# Patient Record
Sex: Male | Born: 1935 | Race: White | Hispanic: No | Marital: Married | Smoking: Never smoker
Health system: Southern US, Community
[De-identification: ages and names within clinical notes are randomized; demographics above are authoritative.]

## PROBLEM LIST (undated history)

## (undated) DIAGNOSIS — R143 Flatulence: Secondary | ICD-10-CM

## (undated) DIAGNOSIS — K5730 Diverticulosis of large intestine without perforation or abscess without bleeding: Secondary | ICD-10-CM

## (undated) DIAGNOSIS — M545 Low back pain: Secondary | ICD-10-CM

## (undated) DIAGNOSIS — J019 Acute sinusitis, unspecified: Secondary | ICD-10-CM

## (undated) DIAGNOSIS — R142 Eructation: Secondary | ICD-10-CM

## (undated) DIAGNOSIS — K8020 Calculus of gallbladder without cholecystitis without obstruction: Secondary | ICD-10-CM

## (undated) DIAGNOSIS — R197 Diarrhea, unspecified: Secondary | ICD-10-CM

## (undated) DIAGNOSIS — R7309 Other abnormal glucose: Secondary | ICD-10-CM

## (undated) DIAGNOSIS — K209 Esophagitis, unspecified: Secondary | ICD-10-CM

## (undated) DIAGNOSIS — K449 Diaphragmatic hernia without obstruction or gangrene: Secondary | ICD-10-CM

## (undated) DIAGNOSIS — I10 Essential (primary) hypertension: Secondary | ICD-10-CM

## (undated) DIAGNOSIS — E785 Hyperlipidemia, unspecified: Secondary | ICD-10-CM

## (undated) DIAGNOSIS — M79609 Pain in unspecified limb: Secondary | ICD-10-CM

## (undated) DIAGNOSIS — K861 Other chronic pancreatitis: Secondary | ICD-10-CM

## (undated) DIAGNOSIS — N400 Enlarged prostate without lower urinary tract symptoms: Secondary | ICD-10-CM

## (undated) DIAGNOSIS — R141 Gas pain: Secondary | ICD-10-CM

## (undated) DIAGNOSIS — K2270 Barrett's esophagus without dysplasia: Secondary | ICD-10-CM

## (undated) DIAGNOSIS — R945 Abnormal results of liver function studies: Secondary | ICD-10-CM

## (undated) DIAGNOSIS — M109 Gout, unspecified: Secondary | ICD-10-CM

## (undated) DIAGNOSIS — K219 Gastro-esophageal reflux disease without esophagitis: Secondary | ICD-10-CM

## (undated) HISTORY — DX: Gas pain: R14.1

## (undated) HISTORY — DX: Eructation: R14.2

## (undated) HISTORY — DX: Essential (primary) hypertension: I10

## (undated) HISTORY — DX: Pain in unspecified limb: M79.609

## (undated) HISTORY — DX: Abnormal results of liver function studies: R94.5

## (undated) HISTORY — DX: Barrett's esophagus without dysplasia: K22.70

## (undated) HISTORY — DX: Hyperlipidemia, unspecified: E78.5

## (undated) HISTORY — DX: Flatulence: R14.3

## (undated) HISTORY — DX: Diverticulosis of large intestine without perforation or abscess without bleeding: K57.30

## (undated) HISTORY — DX: Calculus of gallbladder without cholecystitis without obstruction: K80.20

## (undated) HISTORY — DX: Acute sinusitis, unspecified: J01.90

## (undated) HISTORY — DX: Esophagitis, unspecified: K20.9

## (undated) HISTORY — DX: Other abnormal glucose: R73.09

## (undated) HISTORY — DX: Other chronic pancreatitis: K86.1

## (undated) HISTORY — DX: Diarrhea, unspecified: R19.7

## (undated) HISTORY — DX: Gout, unspecified: M10.9

## (undated) HISTORY — DX: Gastro-esophageal reflux disease without esophagitis: K21.9

## (undated) HISTORY — DX: Diaphragmatic hernia without obstruction or gangrene: K44.9

## (undated) HISTORY — DX: Low back pain: M54.5

## (undated) HISTORY — DX: Benign prostatic hyperplasia without lower urinary tract symptoms: N40.0

---
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
## 1981-06-25 HISTORY — PX: CHOLECYSTECTOMY: SHX55

## 1997-12-01 ENCOUNTER — Ambulatory Visit (HOSPITAL_COMMUNITY): Admission: RE | Admit: 1997-12-01 | Discharge: 1997-12-01 | Payer: Self-pay | Admitting: Internal Medicine

## 1998-01-27 ENCOUNTER — Other Ambulatory Visit: Admission: RE | Admit: 1998-01-27 | Discharge: 1998-01-27 | Payer: Self-pay | Admitting: Gastroenterology

## 1998-06-25 HISTORY — PX: UMBILICAL HERNIA REPAIR: SHX196

## 1998-12-05 ENCOUNTER — Ambulatory Visit (HOSPITAL_COMMUNITY): Admission: RE | Admit: 1998-12-05 | Discharge: 1998-12-05 | Payer: Self-pay | Admitting: Internal Medicine

## 1998-12-05 ENCOUNTER — Encounter: Payer: Self-pay | Admitting: Internal Medicine

## 1998-12-06 ENCOUNTER — Encounter: Admission: RE | Admit: 1998-12-06 | Discharge: 1998-12-06 | Payer: Self-pay | Admitting: Internal Medicine

## 1998-12-15 ENCOUNTER — Encounter: Admission: RE | Admit: 1998-12-15 | Discharge: 1998-12-15 | Payer: Self-pay | Admitting: Internal Medicine

## 1999-06-13 ENCOUNTER — Other Ambulatory Visit: Admission: RE | Admit: 1999-06-13 | Discharge: 1999-06-13 | Payer: Self-pay | Admitting: Gastroenterology

## 1999-06-13 ENCOUNTER — Encounter (INDEPENDENT_AMBULATORY_CARE_PROVIDER_SITE_OTHER): Payer: Self-pay

## 1999-10-31 ENCOUNTER — Ambulatory Visit (HOSPITAL_COMMUNITY): Admission: RE | Admit: 1999-10-31 | Discharge: 1999-10-31 | Payer: Self-pay

## 2001-03-20 ENCOUNTER — Encounter (INDEPENDENT_AMBULATORY_CARE_PROVIDER_SITE_OTHER): Payer: Self-pay | Admitting: Specialist

## 2001-03-20 ENCOUNTER — Other Ambulatory Visit: Admission: RE | Admit: 2001-03-20 | Discharge: 2001-03-20 | Payer: Self-pay | Admitting: Gastroenterology

## 2001-05-16 ENCOUNTER — Encounter: Admission: RE | Admit: 2001-05-16 | Discharge: 2001-05-16 | Payer: Self-pay | Admitting: Cardiothoracic Surgery

## 2001-05-16 ENCOUNTER — Encounter: Payer: Self-pay | Admitting: Cardiothoracic Surgery

## 2001-06-16 ENCOUNTER — Ambulatory Visit (HOSPITAL_COMMUNITY): Admission: RE | Admit: 2001-06-16 | Discharge: 2001-06-16 | Payer: Self-pay | Admitting: Gastroenterology

## 2001-06-16 ENCOUNTER — Encounter: Payer: Self-pay | Admitting: Gastroenterology

## 2001-12-30 ENCOUNTER — Ambulatory Visit (HOSPITAL_COMMUNITY): Admission: RE | Admit: 2001-12-30 | Discharge: 2001-12-30 | Payer: Self-pay | Admitting: Internal Medicine

## 2001-12-30 ENCOUNTER — Encounter: Payer: Self-pay | Admitting: Internal Medicine

## 2002-07-03 ENCOUNTER — Encounter: Payer: Self-pay | Admitting: Cardiothoracic Surgery

## 2002-07-03 ENCOUNTER — Encounter: Payer: Self-pay | Admitting: Internal Medicine

## 2002-07-03 ENCOUNTER — Encounter: Admission: RE | Admit: 2002-07-03 | Discharge: 2002-07-03 | Payer: Self-pay | Admitting: Cardiothoracic Surgery

## 2002-07-03 ENCOUNTER — Encounter: Admission: RE | Admit: 2002-07-03 | Discharge: 2002-07-03 | Payer: Self-pay | Admitting: Internal Medicine

## 2002-12-24 ENCOUNTER — Encounter: Payer: Self-pay | Admitting: Cardiology

## 2003-07-16 ENCOUNTER — Encounter: Admission: RE | Admit: 2003-07-16 | Discharge: 2003-07-16 | Payer: Self-pay | Admitting: Cardiothoracic Surgery

## 2005-01-09 ENCOUNTER — Ambulatory Visit: Payer: Self-pay | Admitting: Internal Medicine

## 2005-01-15 ENCOUNTER — Ambulatory Visit: Payer: Self-pay | Admitting: Internal Medicine

## 2005-04-05 ENCOUNTER — Ambulatory Visit: Payer: Self-pay | Admitting: Gastroenterology

## 2005-04-24 ENCOUNTER — Encounter (INDEPENDENT_AMBULATORY_CARE_PROVIDER_SITE_OTHER): Payer: Self-pay | Admitting: Specialist

## 2005-04-24 ENCOUNTER — Ambulatory Visit: Payer: Self-pay | Admitting: Gastroenterology

## 2005-05-08 ENCOUNTER — Ambulatory Visit: Payer: Self-pay | Admitting: Internal Medicine

## 2005-07-24 ENCOUNTER — Ambulatory Visit: Payer: Self-pay | Admitting: Internal Medicine

## 2005-08-03 ENCOUNTER — Encounter: Admission: RE | Admit: 2005-08-03 | Discharge: 2005-08-03 | Payer: Self-pay | Admitting: Cardiothoracic Surgery

## 2005-10-15 ENCOUNTER — Ambulatory Visit: Payer: Self-pay | Admitting: Internal Medicine

## 2005-11-29 ENCOUNTER — Ambulatory Visit: Payer: Self-pay | Admitting: Internal Medicine

## 2005-12-12 ENCOUNTER — Ambulatory Visit: Payer: Self-pay | Admitting: Gastroenterology

## 2006-01-08 ENCOUNTER — Ambulatory Visit: Payer: Self-pay | Admitting: Internal Medicine

## 2006-01-16 ENCOUNTER — Ambulatory Visit: Payer: Self-pay | Admitting: Internal Medicine

## 2006-01-17 ENCOUNTER — Encounter (INDEPENDENT_AMBULATORY_CARE_PROVIDER_SITE_OTHER): Payer: Self-pay | Admitting: Specialist

## 2006-01-17 ENCOUNTER — Ambulatory Visit: Payer: Self-pay | Admitting: Gastroenterology

## 2006-03-28 ENCOUNTER — Ambulatory Visit: Payer: Self-pay

## 2007-01-15 ENCOUNTER — Ambulatory Visit: Payer: Self-pay | Admitting: Internal Medicine

## 2007-01-15 LAB — CONVERTED CEMR LAB
ALT: 48 units/L (ref 0–53)
AST: 31 units/L (ref 0–37)
Albumin: 4.3 g/dL (ref 3.5–5.2)
Alkaline Phosphatase: 127 units/L — ABNORMAL HIGH (ref 39–117)
BUN: 18 mg/dL (ref 6–23)
Basophils Absolute: 0 10*3/uL (ref 0.0–0.1)
Basophils Relative: 0.2 % (ref 0.0–1.0)
Bilirubin Urine: NEGATIVE
Bilirubin, Direct: 0.1 mg/dL (ref 0.0–0.3)
CO2: 31 meq/L (ref 19–32)
Calcium: 9.4 mg/dL (ref 8.4–10.5)
Chloride: 105 meq/L (ref 96–112)
Cholesterol: 209 mg/dL (ref 0–200)
Creatinine, Ser: 1.1 mg/dL (ref 0.4–1.5)
Direct LDL: 109.7 mg/dL
Eosinophils Absolute: 0.3 10*3/uL (ref 0.0–0.6)
Eosinophils Relative: 3.7 % (ref 0.0–5.0)
GFR calc Af Amer: 85 mL/min
GFR calc non Af Amer: 70 mL/min
Glucose, Bld: 102 mg/dL — ABNORMAL HIGH (ref 70–99)
HCT: 45.3 % (ref 39.0–52.0)
HDL: 48.9 mg/dL (ref >39.0)
Hemoglobin, Urine: NEGATIVE
Hemoglobin: 15.5 g/dL (ref 13.0–17.0)
Ketones, ur: NEGATIVE mg/dL
Leukocytes, UA: NEGATIVE
Lymphocytes Relative: 18.9 % (ref 12.0–46.0)
MCHC: 34.1 g/dL (ref 30.0–36.0)
MCV: 91 fL (ref 78.0–100.0)
Monocytes Absolute: 0.7 10*3/uL (ref 0.2–0.7)
Monocytes Relative: 8.6 % (ref 3.0–11.0)
Neutro Abs: 5.4 10*3/uL (ref 1.4–7.7)
Neutrophils Relative %: 68.6 % (ref 43.0–77.0)
Nitrite: NEGATIVE
PSA: 1 ng/mL (ref 0.10–4.00)
Platelets: 216 10*3/uL (ref 150–400)
Potassium: 4.6 meq/L (ref 3.5–5.1)
RBC: 4.98 M/uL (ref 4.22–5.81)
RDW: 12.5 % (ref 11.5–14.6)
Sodium: 143 meq/L (ref 135–145)
Specific Gravity, Urine: 1.025 (ref 1.000–1.03)
TSH: 0.67 microintl units/mL (ref 0.35–5.50)
Total Bilirubin: 1.3 mg/dL — ABNORMAL HIGH (ref 0.3–1.2)
Total CHOL/HDL Ratio: 4.3
Total Protein, Urine: NEGATIVE mg/dL
Total Protein: 6.8 g/dL (ref 6.0–8.3)
Triglycerides: 184 mg/dL — ABNORMAL HIGH (ref 0–149)
Urine Glucose: NEGATIVE mg/dL
Urobilinogen, UA: 0.2 (ref 0.0–1.0)
VLDL: 37 mg/dL (ref 0–40)
WBC: 7.9 10*3/uL (ref 4.5–10.5)
pH: 6 (ref 5.0–8.0)

## 2007-01-23 ENCOUNTER — Ambulatory Visit: Payer: Self-pay | Admitting: Internal Medicine

## 2007-01-28 DIAGNOSIS — K209 Esophagitis, unspecified without bleeding: Secondary | ICD-10-CM | POA: Insufficient documentation

## 2007-01-28 DIAGNOSIS — E785 Hyperlipidemia, unspecified: Secondary | ICD-10-CM

## 2007-01-28 DIAGNOSIS — I10 Essential (primary) hypertension: Secondary | ICD-10-CM

## 2007-01-28 HISTORY — DX: Hyperlipidemia, unspecified: E78.5

## 2007-01-28 HISTORY — DX: Esophagitis, unspecified without bleeding: K20.90

## 2007-01-28 HISTORY — DX: Essential (primary) hypertension: I10

## 2007-04-23 DIAGNOSIS — M109 Gout, unspecified: Secondary | ICD-10-CM

## 2007-04-23 DIAGNOSIS — N400 Enlarged prostate without lower urinary tract symptoms: Secondary | ICD-10-CM

## 2007-04-23 DIAGNOSIS — K219 Gastro-esophageal reflux disease without esophagitis: Secondary | ICD-10-CM

## 2007-04-23 HISTORY — DX: Gastro-esophageal reflux disease without esophagitis: K21.9

## 2007-04-23 HISTORY — DX: Gout, unspecified: M10.9

## 2007-04-23 HISTORY — DX: Benign prostatic hyperplasia without lower urinary tract symptoms: N40.0

## 2007-05-14 ENCOUNTER — Ambulatory Visit: Payer: Self-pay | Admitting: Internal Medicine

## 2007-05-14 DIAGNOSIS — R945 Abnormal results of liver function studies: Secondary | ICD-10-CM

## 2007-05-14 DIAGNOSIS — J019 Acute sinusitis, unspecified: Secondary | ICD-10-CM

## 2007-05-14 HISTORY — DX: Acute sinusitis, unspecified: J01.90

## 2007-05-14 HISTORY — DX: Abnormal results of liver function studies: R94.5

## 2007-05-20 ENCOUNTER — Encounter: Payer: Self-pay | Admitting: Internal Medicine

## 2007-09-10 ENCOUNTER — Telehealth: Payer: Self-pay | Admitting: Internal Medicine

## 2007-12-23 ENCOUNTER — Ambulatory Visit: Payer: Self-pay | Admitting: Gastroenterology

## 2008-01-07 ENCOUNTER — Encounter: Payer: Self-pay | Admitting: Gastroenterology

## 2008-01-07 ENCOUNTER — Ambulatory Visit: Payer: Self-pay | Admitting: Gastroenterology

## 2008-01-12 ENCOUNTER — Encounter: Payer: Self-pay | Admitting: Gastroenterology

## 2008-01-20 ENCOUNTER — Ambulatory Visit: Payer: Self-pay | Admitting: Internal Medicine

## 2008-01-21 LAB — CONVERTED CEMR LAB
ALT: 90 units/L — ABNORMAL HIGH (ref 0–53)
AST: 53 units/L — ABNORMAL HIGH (ref 0–37)
Albumin: 4.2 g/dL (ref 3.5–5.2)
Alkaline Phosphatase: 168 units/L — ABNORMAL HIGH (ref 39–117)
BUN: 20 mg/dL (ref 6–23)
Basophils Absolute: 0 10*3/uL (ref 0.0–0.1)
Basophils Relative: 0.3 % (ref 0.0–3.0)
Bilirubin Urine: NEGATIVE
Bilirubin, Direct: 0.2 mg/dL (ref 0.0–0.3)
CO2: 29 meq/L (ref 19–32)
Calcium: 9.3 mg/dL (ref 8.4–10.5)
Chloride: 104 meq/L (ref 96–112)
Cholesterol: 227 mg/dL (ref 0–200)
Creatinine, Ser: 1.2 mg/dL (ref 0.4–1.5)
Direct LDL: 124.9 mg/dL
Eosinophils Absolute: 0.3 10*3/uL (ref 0.0–0.7)
Eosinophils Relative: 4.7 % (ref 0.0–5.0)
GFR calc Af Amer: 77 mL/min
GFR calc non Af Amer: 63 mL/min
Glucose, Bld: 114 mg/dL — ABNORMAL HIGH (ref 70–99)
HCT: 44.6 % (ref 39.0–52.0)
HDL: 51.1 mg/dL (ref >39.0)
Hemoglobin, Urine: NEGATIVE
Hemoglobin: 15.6 g/dL (ref 13.0–17.0)
Ketones, ur: NEGATIVE mg/dL
Leukocytes, UA: NEGATIVE
Lymphocytes Relative: 20.2 % (ref 12.0–46.0)
MCHC: 35 g/dL (ref 30.0–36.0)
MCV: 91.9 fL (ref 78.0–100.0)
Monocytes Absolute: 0.7 10*3/uL (ref 0.1–1.0)
Monocytes Relative: 9.4 % (ref 3.0–12.0)
Neutro Abs: 4.9 10*3/uL (ref 1.4–7.7)
Neutrophils Relative %: 65.4 % (ref 43.0–77.0)
Nitrite: NEGATIVE
PSA: 1.17 ng/mL (ref 0.10–4.00)
Platelets: 201 10*3/uL (ref 150–400)
Potassium: 4.4 meq/L (ref 3.5–5.1)
RBC: 4.85 M/uL (ref 4.22–5.81)
RDW: 13.1 % (ref 11.5–14.6)
Sodium: 140 meq/L (ref 135–145)
Specific Gravity, Urine: 1.02 (ref 1.000–1.03)
TSH: 0.5 microintl units/mL (ref 0.35–5.50)
Total Bilirubin: 1.4 mg/dL — ABNORMAL HIGH (ref 0.3–1.2)
Total CHOL/HDL Ratio: 4.4
Total Protein, Urine: NEGATIVE mg/dL
Total Protein: 6.8 g/dL (ref 6.0–8.3)
Triglycerides: 244 mg/dL (ref 0–149)
Urine Glucose: NEGATIVE mg/dL
Urobilinogen, UA: 0.2 (ref 0.0–1.0)
VLDL: 49 mg/dL — ABNORMAL HIGH (ref 0–40)
WBC: 7.4 10*3/uL (ref 4.5–10.5)
pH: 6 (ref 5.0–8.0)

## 2008-01-26 ENCOUNTER — Ambulatory Visit: Payer: Self-pay | Admitting: Internal Medicine

## 2008-01-26 DIAGNOSIS — R197 Diarrhea, unspecified: Secondary | ICD-10-CM

## 2008-01-26 DIAGNOSIS — R7309 Other abnormal glucose: Secondary | ICD-10-CM

## 2008-01-26 HISTORY — DX: Other abnormal glucose: R73.09

## 2008-01-26 HISTORY — DX: Diarrhea, unspecified: R19.7

## 2008-02-09 DIAGNOSIS — K2270 Barrett's esophagus without dysplasia: Secondary | ICD-10-CM

## 2008-02-09 DIAGNOSIS — K449 Diaphragmatic hernia without obstruction or gangrene: Secondary | ICD-10-CM | POA: Insufficient documentation

## 2008-02-09 HISTORY — DX: Diaphragmatic hernia without obstruction or gangrene: K44.9

## 2008-02-09 HISTORY — DX: Barrett's esophagus without dysplasia: K22.70

## 2008-02-10 ENCOUNTER — Ambulatory Visit: Payer: Self-pay | Admitting: Gastroenterology

## 2008-02-10 DIAGNOSIS — K5730 Diverticulosis of large intestine without perforation or abscess without bleeding: Secondary | ICD-10-CM | POA: Insufficient documentation

## 2008-02-10 DIAGNOSIS — K861 Other chronic pancreatitis: Secondary | ICD-10-CM

## 2008-02-10 HISTORY — DX: Other chronic pancreatitis: K86.1

## 2008-02-10 HISTORY — DX: Diverticulosis of large intestine without perforation or abscess without bleeding: K57.30

## 2008-05-25 ENCOUNTER — Telehealth: Payer: Self-pay | Admitting: Internal Medicine

## 2008-08-18 ENCOUNTER — Telehealth: Payer: Self-pay | Admitting: Internal Medicine

## 2008-09-13 ENCOUNTER — Telehealth: Payer: Self-pay | Admitting: Internal Medicine

## 2008-11-08 ENCOUNTER — Telehealth: Payer: Self-pay | Admitting: Internal Medicine

## 2008-11-19 ENCOUNTER — Encounter (INDEPENDENT_AMBULATORY_CARE_PROVIDER_SITE_OTHER): Payer: Self-pay | Admitting: *Deleted

## 2009-02-14 ENCOUNTER — Ambulatory Visit: Payer: Self-pay | Admitting: Internal Medicine

## 2009-02-14 LAB — CONVERTED CEMR LAB
ALT: 55 units/L — ABNORMAL HIGH (ref 0–53)
AST: 27 units/L (ref 0–37)
Albumin: 4.3 g/dL (ref 3.5–5.2)
Alkaline Phosphatase: 164 units/L — ABNORMAL HIGH (ref 39–117)
BUN: 19 mg/dL (ref 6–23)
Basophils Absolute: 0 10*3/uL (ref 0.0–0.1)
Basophils Relative: 0.3 % (ref 0.0–3.0)
Bilirubin Urine: NEGATIVE
Bilirubin, Direct: 0.1 mg/dL (ref 0.0–0.3)
CO2: 30 meq/L (ref 19–32)
Calcium: 9.4 mg/dL (ref 8.4–10.5)
Chloride: 107 meq/L (ref 96–112)
Cholesterol: 200 mg/dL (ref 0–200)
Creatinine, Ser: 1.1 mg/dL (ref 0.4–1.5)
Direct LDL: 94.5 mg/dL
Eosinophils Absolute: 0.3 10*3/uL (ref 0.0–0.7)
Eosinophils Relative: 4.2 % (ref 0.0–5.0)
GFR calc non Af Amer: 69.88 mL/min (ref >60)
Glucose, Bld: 105 mg/dL — ABNORMAL HIGH (ref 70–99)
HCT: 45.2 % (ref 39.0–52.0)
HDL: 47.4 mg/dL (ref >39.00)
Hemoglobin: 15.4 g/dL (ref 13.0–17.0)
Ketones, ur: NEGATIVE mg/dL
Leukocytes, UA: NEGATIVE
Lymphocytes Relative: 23.9 % (ref 12.0–46.0)
Lymphs Abs: 1.7 10*3/uL (ref 0.7–4.0)
MCHC: 34 g/dL (ref 30.0–36.0)
MCV: 93.8 fL (ref 78.0–100.0)
Monocytes Absolute: 0.7 10*3/uL (ref 0.1–1.0)
Monocytes Relative: 9.8 % (ref 3.0–12.0)
Neutro Abs: 4.3 10*3/uL (ref 1.4–7.7)
Neutrophils Relative %: 61.8 % (ref 43.0–77.0)
Nitrite: NEGATIVE
PSA: 0.8 ng/mL (ref 0.10–4.00)
Platelets: 208 10*3/uL (ref 150.0–400.0)
Potassium: 4.6 meq/L (ref 3.5–5.1)
RBC: 4.82 M/uL (ref 4.22–5.81)
RDW: 13.4 % (ref 11.5–14.6)
Sodium: 142 meq/L (ref 135–145)
Specific Gravity, Urine: 1.02 (ref 1.000–1.030)
TSH: 0.59 microintl units/mL (ref 0.35–5.50)
Total Bilirubin: 1.1 mg/dL (ref 0.3–1.2)
Total CHOL/HDL Ratio: 4
Total Protein, Urine: NEGATIVE mg/dL
Total Protein: 7.7 g/dL (ref 6.0–8.3)
Triglycerides: 344 mg/dL — ABNORMAL HIGH (ref 0.0–149.0)
Urine Glucose: NEGATIVE mg/dL
Urobilinogen, UA: 0.2 (ref 0.0–1.0)
VLDL: 68.8 mg/dL — ABNORMAL HIGH (ref 0.0–40.0)
WBC: 7 10*3/uL (ref 4.5–10.5)
pH: 6 (ref 5.0–8.0)

## 2009-02-21 ENCOUNTER — Ambulatory Visit: Payer: Self-pay | Admitting: Internal Medicine

## 2009-03-29 ENCOUNTER — Encounter (INDEPENDENT_AMBULATORY_CARE_PROVIDER_SITE_OTHER): Payer: Self-pay | Admitting: *Deleted

## 2009-04-25 ENCOUNTER — Ambulatory Visit: Payer: Self-pay | Admitting: Gastroenterology

## 2009-06-06 ENCOUNTER — Telehealth: Payer: Self-pay | Admitting: Gastroenterology

## 2009-07-08 ENCOUNTER — Encounter (INDEPENDENT_AMBULATORY_CARE_PROVIDER_SITE_OTHER): Payer: Self-pay | Admitting: *Deleted

## 2009-07-11 ENCOUNTER — Ambulatory Visit: Payer: Self-pay | Admitting: Gastroenterology

## 2009-07-18 ENCOUNTER — Ambulatory Visit: Payer: Self-pay | Admitting: Gastroenterology

## 2009-07-20 ENCOUNTER — Encounter: Payer: Self-pay | Admitting: Gastroenterology

## 2009-09-29 ENCOUNTER — Telehealth: Payer: Self-pay | Admitting: Internal Medicine

## 2009-10-10 ENCOUNTER — Ambulatory Visit: Payer: Self-pay | Admitting: Internal Medicine

## 2009-10-10 DIAGNOSIS — M79609 Pain in unspecified limb: Secondary | ICD-10-CM

## 2009-10-10 HISTORY — DX: Pain in unspecified limb: M79.609

## 2010-03-07 ENCOUNTER — Ambulatory Visit: Payer: Self-pay | Admitting: Internal Medicine

## 2010-03-07 LAB — CONVERTED CEMR LAB
ALT: 119 units/L — ABNORMAL HIGH (ref 0–53)
AST: 49 units/L — ABNORMAL HIGH (ref 0–37)
Albumin: 4.2 g/dL (ref 3.5–5.2)
Alkaline Phosphatase: 310 units/L — ABNORMAL HIGH (ref 39–117)
BUN: 20 mg/dL (ref 6–23)
Basophils Absolute: 0 10*3/uL (ref 0.0–0.1)
Basophils Relative: 0.4 % (ref 0.0–3.0)
Bilirubin Urine: NEGATIVE
Bilirubin, Direct: 0.2 mg/dL (ref 0.0–0.3)
CO2: 31 meq/L (ref 19–32)
Calcium: 9.5 mg/dL (ref 8.4–10.5)
Chloride: 105 meq/L (ref 96–112)
Cholesterol: 200 mg/dL (ref 0–200)
Creatinine, Ser: 1.1 mg/dL (ref 0.4–1.5)
Direct LDL: 104.8 mg/dL
Eosinophils Absolute: 0.2 10*3/uL (ref 0.0–0.7)
Eosinophils Relative: 3.6 % (ref 0.0–5.0)
GFR calc non Af Amer: 72.72 mL/min (ref >60)
Glucose, Bld: 102 mg/dL — ABNORMAL HIGH (ref 70–99)
HCT: 43.6 % (ref 39.0–52.0)
HDL: 49.1 mg/dL (ref >39.00)
Hemoglobin, Urine: NEGATIVE
Hemoglobin: 15 g/dL (ref 13.0–17.0)
Ketones, ur: NEGATIVE mg/dL
Leukocytes, UA: NEGATIVE
Lymphocytes Relative: 25.7 % (ref 12.0–46.0)
Lymphs Abs: 1.7 10*3/uL (ref 0.7–4.0)
MCHC: 34.4 g/dL (ref 30.0–36.0)
MCV: 96.2 fL (ref 78.0–100.0)
Monocytes Absolute: 0.6 10*3/uL (ref 0.1–1.0)
Monocytes Relative: 8.3 % (ref 3.0–12.0)
Neutro Abs: 4.1 10*3/uL (ref 1.4–7.7)
Neutrophils Relative %: 62 % (ref 43.0–77.0)
Nitrite: NEGATIVE
PSA: 0.92 ng/mL (ref 0.10–4.00)
Platelets: 247 10*3/uL (ref 150.0–400.0)
Potassium: 5.4 meq/L — ABNORMAL HIGH (ref 3.5–5.1)
RBC: 4.53 M/uL (ref 4.22–5.81)
RDW: 14.4 % (ref 11.5–14.6)
Sodium: 143 meq/L (ref 135–145)
Specific Gravity, Urine: 1.02 (ref 1.000–1.030)
TSH: 0.53 microintl units/mL (ref 0.35–5.50)
Total Bilirubin: 0.8 mg/dL (ref 0.3–1.2)
Total CHOL/HDL Ratio: 4
Total Protein, Urine: NEGATIVE mg/dL
Total Protein: 7 g/dL (ref 6.0–8.3)
Triglycerides: 258 mg/dL — ABNORMAL HIGH (ref 0.0–149.0)
Urine Glucose: NEGATIVE mg/dL
Urobilinogen, UA: 0.2 (ref 0.0–1.0)
VLDL: 51.6 mg/dL — ABNORMAL HIGH (ref 0.0–40.0)
WBC: 6.6 10*3/uL (ref 4.5–10.5)
pH: 6 (ref 5.0–8.0)

## 2010-03-13 ENCOUNTER — Ambulatory Visit: Payer: Self-pay | Admitting: Internal Medicine

## 2010-03-20 ENCOUNTER — Encounter: Admission: RE | Admit: 2010-03-20 | Discharge: 2010-03-20 | Payer: Self-pay | Admitting: Internal Medicine

## 2010-03-23 ENCOUNTER — Telehealth: Payer: Self-pay | Admitting: Internal Medicine

## 2010-04-12 ENCOUNTER — Telehealth: Payer: Self-pay | Admitting: Internal Medicine

## 2010-04-17 ENCOUNTER — Ambulatory Visit: Payer: Self-pay | Admitting: Internal Medicine

## 2010-04-18 LAB — CONVERTED CEMR LAB
ALT: 90 units/L — ABNORMAL HIGH (ref 0–53)
AST: 43 units/L — ABNORMAL HIGH (ref 0–37)
Albumin: 4 g/dL (ref 3.5–5.2)
Alkaline Phosphatase: 241 units/L — ABNORMAL HIGH (ref 39–117)
BUN: 19 mg/dL (ref 6–23)
Bilirubin, Direct: 0.3 mg/dL (ref 0.0–0.3)
CO2: 26 meq/L (ref 19–32)
Calcium: 9.1 mg/dL (ref 8.4–10.5)
Chloride: 104 meq/L (ref 96–112)
Creatinine, Ser: 1 mg/dL (ref 0.4–1.5)
GFR calc non Af Amer: 76 mL/min (ref >60)
Glucose, Bld: 89 mg/dL (ref 70–99)
Potassium: 4.4 meq/L (ref 3.5–5.1)
Sodium: 139 meq/L (ref 135–145)
Total Bilirubin: 1.1 mg/dL (ref 0.3–1.2)
Total Protein: 6.5 g/dL (ref 6.0–8.3)

## 2010-04-24 ENCOUNTER — Ambulatory Visit: Payer: Self-pay | Admitting: Internal Medicine

## 2010-05-04 ENCOUNTER — Telehealth: Payer: Self-pay | Admitting: Internal Medicine

## 2010-05-10 ENCOUNTER — Ambulatory Visit: Payer: Self-pay | Admitting: Internal Medicine

## 2010-06-08 ENCOUNTER — Telehealth: Payer: Self-pay | Admitting: Internal Medicine

## 2010-06-12 ENCOUNTER — Ambulatory Visit: Payer: Self-pay | Admitting: Internal Medicine

## 2010-06-12 LAB — CONVERTED CEMR LAB
ALT: 87 units/L — ABNORMAL HIGH (ref 0–53)
AST: 47 units/L — ABNORMAL HIGH (ref 0–37)
Albumin: 4.2 g/dL (ref 3.5–5.2)
Alkaline Phosphatase: 208 units/L — ABNORMAL HIGH (ref 39–117)
Bilirubin, Direct: 0.3 mg/dL (ref 0.0–0.3)
Total Bilirubin: 1.7 mg/dL — ABNORMAL HIGH (ref 0.3–1.2)
Total Protein: 6.9 g/dL (ref 6.0–8.3)
Uric Acid, Serum: 7.1 mg/dL (ref 4.0–7.8)

## 2010-07-25 NOTE — Progress Notes (Signed)
Summary: Allopurinol   Phone Note From Pharmacy   Summary of Call: Pharm has pt filling allopurinol 300mg  once daily and EMR has 100mg . Ok to continue what pharm has?  Initial call taken by: Lamar Sprinkles, CMA,  March 23, 2010 10:39 AM  Follow-up for Phone Call        Hew needs 100 mg/day pls, not 300 mg Follow-up by: Tresa Garter MD,  March 23, 2010 11:37 AM    Prescriptions: LOTREL 5-20 MG  CAPS (AMLODIPINE BESY-BENAZEPRIL HCL) once daily  #90 x 3   Entered by:   Lamar Sprinkles, CMA   Authorized by:   Tresa Garter MD   Signed by:   Lamar Sprinkles, CMA on 03/23/2010   Method used:   [id number]       [address]       [place], [place]  [address]       Ph: [phone] or [phone]       Fax: [phone]   RxID:   [id number] AVODART 0.5 MG  CAPS (DUTASTERIDE) once daily  #90 x 3   Entered by:   Lamar Sprinkles, CMA   Authorized by:   Tresa Garter MD   Signed by:   Lamar Sprinkles, CMA on 03/23/2010   Method used:   [facility] to        [id number]       [address]       [place], [place]  [address]       Ph: [phone] or [phone]       Fax: [phone]   RxID:   [id number] ALLOPURINOL 100 MG TABS (ALLOPURINOL) 1 by mouth once daily for gout prevention  #90 x 3   Entered by:   Lamar Sprinkles, CMA   Authorized by:   Tresa Garter MD   Signed by:   Lamar Sprinkles, CMA on 03/23/2010   Method used:   [facility]        [id number]       [address]       [place], [place]  [address]       Ph: [phone] or [phone]       Fax: [phone]   RxID:   [id number]

## 2010-07-25 NOTE — Assessment & Plan Note (Signed)
Summary: CPX/ [employer] /NWS  #   Vital Signs:  Patient profile:   76 year old male Height:      71 inches Weight:      212 pounds BMI:     29.67 Temp:     98.9 degrees F oral Pulse rate:   80 / minute Pulse rhythm:   regular Resp:     16 per minute BP sitting:   122 / 78  (left arm) Cuff size:   regular  Vitals Entered By: Lanier Prude, [name] Gust) (March 13, 2010 3:10 PM) CC: CPX Is Patient Diabetic? No   Primary Care Provider:  Trinna Post Ulys Favia,MD  CC:  CPX.  History of Present Illness: The patient presents for a preventive health examination   Preventive Screening-Counseling & Management  Alcohol-Tobacco     Smoking Status: never  Current Medications (verified): 1)  Lotrel 5-20 Mg  Caps (Amlodipine Besy-Benazepril Hcl) .... Once Daily 2)  Valturna 150-160 Mg Tabs (Aliskiren-Valsartan) .Marland Kitchen.. 1 By Mouth Once Daily For Blood Pressure 3)  Nexium 40 Mg  Cpdr (Esomeprazole Magnesium) .Marland Kitchen.. 1 By Mouth Bid 4)  Welchol 625 Mg  Tabs (Colesevelam Hcl) .Marland Kitchen.. 1 Two Times A Day 5)  Avodart 0.5 Mg  Caps (Dutasteride) .... Once Daily 6)  Viagra 100 Mg Tabs (Sildenafil Citrate) .Marland Kitchen.. 1 Once Daily Prn 7)  Vitamin D3 1000 Unit  Tabs (Cholecalciferol) .Marland Kitchen.. 1 Qd 8)  Allopurinol 300 Mg Tabs (Allopurinol) .... Take 1 Tab Every Day 9)  Pennsaid 1.5 % Soln (Diclofenac Sodium) .... 3-5 Gtt On Skin Three Times A Day For Pain  Allergies (verified): 1)  ! Kapidex  Past History:  Past Medical History: Last updated: 02/21/2009 Hyperlipidemia Hypertension Elevated LFTs since the 80's GERD/ Barrett's -- Dr Jarold Motto Benign prostatic hypertrophy Gout, remote Elev. glu Chronic diarrhea  Past Surgical History: Last updated: 04/23/2007 Cholecystectomy  Family History: Last updated: 02/10/2008 F laryngeal CA M 95 No FH of Colon Cancer:  Social History: Retired Married Never Smoked Alcohol use-yes socially wine 3/d Regular exercise-yes  Illicit Drug Use - no s  Review of  Systems  The patient denies anorexia, fever, weight loss, weight gain, vision loss, decreased hearing, hoarseness, chest pain, syncope, dyspnea on exertion, peripheral edema, prolonged cough, headaches, hemoptysis, abdominal pain, melena, hematochezia, severe indigestion/heartburn, hematuria, incontinence, genital sores, muscle weakness, suspicious skin lesions, transient blindness, difficulty walking, depression, unusual weight change, abnormal bleeding, enlarged lymph nodes, angioedema, and testicular masses.    Physical Exam  General:  Well developed, well nourished, no acute distress.healthy appearing.   Head:  Normocephalic and atraumatic without obvious abnormalities. No apparent alopecia or balding. Eyes:  No corneal or conjunctival inflammation noted. EOMI. Perrla.  Ears:  External ear exam shows no significant lesions or deformities.  Otoscopic examination reveals clear canals, tympanic membranes are intact bilaterally without bulging, retraction, inflammation or discharge. Hearing is grossly normal bilaterally. Nose:  External nasal examination shows no deformity or inflammation. Nasal mucosa are pink and moist without lesions or exudates. Mouth:  Oral mucosa and oropharynx without lesions or exudates.  Teeth in good repair. Neck:  No deformities, masses, or tenderness noted. Lungs:  Normal respiratory effort, chest expands symmetrically. Lungs are clear to auscultation, no crackles or wheezes. Heart:  Normal rate and regular rhythm. S1 and S2 normal without gallop, murmur, click, rub or other extra sounds. Abdomen:  Bowel sounds positive,abdomen soft and non-tender without masses, organomegaly or hernias noted. Rectal:  No external abnormalities noted. Normal sphincter tone. No rectal  masses or tenderness. Genitalia:  Testes bilaterally descended without nodularity, tenderness or masses. No scrotal masses or lesions. No penis lesions or urethral discharge. Prostate:  no nodules and 1+  enlarged.   Msk:  WNL Pulses:  R and L carotid,radial,femoral,dorsalis pedis and posterior tibial pulses are full and equal bilaterally Extremities:  No clubbing, cyanosis, edema, or deformity noted with normal full range of motion of all joints.   Neurologic:  No cranial nerve deficits noted. Station and gait are normal. Plantar reflexes are down-going bilaterally. DTRs are symmetrical throughout. Sensory, motor and coordinative functions appear intact. Skin:  Intact without suspicious lesions or rashes Cervical Nodes:  No lymphadenopathy noted Inguinal Nodes:  No significant adenopathy Psych:  Cognition and judgment appear intact. Alert and cooperative with normal attention span and concentration. No apparent delusions, illusions, hallucinations   Impression & Recommendations:  Problem # 1:  WELL ADULT EXAM (ICD-V70.0) Assessment New Health and age related issues were discussed. Available screening tests and vaccinations were discussed as well. Healthy life style including good diet and exercise was discussed.  The labs were reviewed with the patient.  Orders: EKG w/ Interpretation (93000) OK  Problem # 2:  HYPERTENSION (ICD-401.9) Assessment: Improved  His updated medication list for this problem includes:    Lotrel 5-20 Mg Caps (Amlodipine besy-benazepril hcl) ..... Once daily    Valturna 150-160 Mg Tabs (Aliskiren-valsartan) .Marland Kitchen... 1 by mouth once daily for blood pressure  BP today: 122/78 Prior BP: 144/76 (10/10/2009)  Labs Reviewed: K+: 5.4 (03/07/2010) Creat: : 1.1 (03/07/2010)   Chol: 200 (03/07/2010)   HDL: 49.10 (03/07/2010)   LDL: DEL (01/20/2008)   TG: 258.0 (03/07/2010)  Problem # 3:  ABNORMAL LIVER FUNCTION TESTS (ICD-794.8) Assessment: Deteriorated Cut back on wine and reduce Allopurinol Orders: Radiology Referral (Radiology) abd [place]  Problem # 4:  GOUT (ICD-274.9) Assessment: Improved  The following medications were removed from the medication list:     Allopurinol 300 Mg Tabs (Allopurinol) .Marland Kitchen... Take 1 tab every day His updated medication list for this problem includes:    Allopurinol 100 Mg Tabs (Allopurinol) .Marland Kitchen... 1 by mouth once daily for gout prevention  Problem # 5:  ESOPHAGITIS (ICD-530.10) Assessment: Improved  His updated medication list for this problem includes:    Nexium 40 Mg Cpdr (Esomeprazole magnesium) .Marland Kitchen... 1 by mouth bid  Complete Medication List: 1)  Lotrel 5-20 Mg Caps (Amlodipine besy-benazepril hcl) .... Once daily 2)  Valturna 150-160 Mg Tabs (Aliskiren-valsartan) .Marland Kitchen.. 1 by mouth once daily for blood pressure 3)  Nexium 40 Mg Cpdr (Esomeprazole magnesium) .Marland Kitchen.. 1 by mouth bid 4)  Welchol 625 Mg Tabs (Colesevelam hcl) .Marland Kitchen.. 1 two times a day 5)  Avodart 0.5 Mg Caps (Dutasteride) .... Once daily 6)  Viagra 100 Mg Tabs (Sildenafil citrate) .Marland Kitchen.. 1 once daily prn 7)  Vitamin D3 1000 Unit Tabs (Cholecalciferol) .Marland Kitchen.. 1 qd 8)  Pennsaid 1.5 % Soln (Diclofenac sodium) .... 3-5 gtt on skin three times a day for pain 9)  Ciprofloxacin Hcl 500 Mg Tabs (Ciprofloxacin hcl) .Marland Kitchen.. 1 by mouth bid 10)  Promethazine Hcl 25 Mg Tabs (Promethazine hcl) .Marland Kitchen.. 1-2 by mouth four times a day as needed nausea 11)  Lomotil 2.5-0.025 Mg Tabs (Diphenoxylate-atropine) .Marland Kitchen.. 1-2 by mouth qid as needed diarrhea 12)  Allopurinol 100 Mg Tabs (Allopurinol) .Marland Kitchen.. 1 by mouth once daily for gout prevention  Other Orders: Tdap => 51yrs IM (11914) Admin 1st Vaccine (78295) Flu Vaccine 69yrs + (62130)  Patient Instructions: 1)  Please schedule a follow-up appointment in 6 weeks. 2)  BMP prior to visit, ICD-9: 3)  Hepatic Panel prior to visit, ICD-9: 995.20 Prescriptions: ALLOPURINOL 100 MG TABS (ALLOPURINOL) 1 by mouth once daily for gout prevention  #30 x 12   Entered and Authorized by:   Tresa Garter MD   Signed by:   Tresa Garter MD on 03/13/2010   Method used:   Print then Give to Patient   RxID:   [id number] PENNSAID 1.5 % SOLN  (DICLOFENAC SODIUM) 3-5 gtt on skin three times a day for pain  #1 x 3   Entered and Authorized by:   Tresa Garter MD   Signed by:   Tresa Garter MD on 03/13/2010   Method used:   Print then Give to Patient   RxID:   [id number] VIAGRA 100 MG TABS (SILDENAFIL CITRATE) 1 once daily prn  #12 x 12   Entered and Authorized by:   Tresa Garter MD   Signed by:   Tresa Garter MD on 03/13/2010   Method used:   Print then Give to Patient   RxID:   [id number] AVODART 0.5 MG  CAPS (DUTASTERIDE) once daily  #30 x 12   Entered and Authorized by:   Tresa Garter MD   Signed by:   Tresa Garter MD on 03/13/2010   Method used:   Print then Give to Patient   RxID:   [id number] WELCHOL 625 MG  TABS (COLESEVELAM HCL) 1 two times a day  #60 Tablet x 5   Entered and Authorized by:   Tresa Garter MD   Signed by:   Tresa Garter MD on 03/13/2010   Method used:   Print then Give to Patient   RxID:   [id number] NEXIUM 40 MG  CPDR (ESOMEPRAZOLE MAGNESIUM) 1 by mouth bid  #60 Capsule x 5   Entered and Authorized by:   Tresa Garter MD   Signed by:   Tresa Garter MD on 03/13/2010   Method used:   Print then Give to Patient   RxID:   [id number] VALTURNA 150-160 MG TABS (ALISKIREN-VALSARTAN) 1 by mouth once daily for blood pressure  #30 x 12   Entered and Authorized by:   Tresa Garter MD   Signed by:   Tresa Garter MD on 03/13/2010   Method used:   Print then Give to Patient   RxID:   [id number] LOTREL 5-20 MG  CAPS (AMLODIPINE BESY-BENAZEPRIL HCL) once daily  #30 x 12   Entered and Authorized by:   Tresa Garter MD   Signed by:   Tresa Garter MD on 03/13/2010   Method used:   Print then Give to Patient   RxID:   [id number] LOMOTIL 2.5-0.025 MG TABS (DIPHENOXYLATE-ATROPINE) 1-2 by mouth qid as needed diarrhea  #60 x 0   Entered and Authorized by:   Tresa Garter MD    Signed by:   Tresa Garter MD on 03/13/2010   Method used:   Print then Give to Patient   RxID:   [id number] PROMETHAZINE HCL 25 MG TABS (PROMETHAZINE HCL) 1-2 by mouth four times a day as needed nausea  #60 x 0   Entered and Authorized by:   Tresa Garter MD   Signed by:   Tresa Garter MD on 03/13/2010   Method used:   Print then Give to Patient   RxID:  [id number] CIPROFLOXACIN HCL 500 MG TABS (CIPROFLOXACIN HCL) 1 by mouth bid  #20 x 1   Entered and Authorized by:   Tresa Garter MD   Signed by:   Tresa Garter MD on 03/13/2010   Method used:   Print then Give to Patient   RxID:   [id number] ZITHROMAX Z-PAK 250 MG TABS (AZITHROMYCIN) as dirrected  #1 x 0   Entered and Authorized by:   Tresa Garter MD   Signed by:   Tresa Garter MD on 03/13/2010   Method used:   Print then Give to Patient   RxID:   [id number]    Immunizations Administered:  Tetanus Vaccine:    Vaccine Type: Tdap    Site: left deltoid    Mfr: GlaxoSmithKline    Dose: 0.5 ml    Route: IM    Given by: Lanier Prude, CMA(AAMA)    Exp. Date: 03/15/2012    Lot #: [id number]    VIS given: 05/13/07 version given March 13, 2010.  Marland Kitchenlbflu   Flu Vaccine Consent Questions     Do you have a history of severe allergic reactions to this vaccine? no    Any prior history of allergic reactions to egg and/or gelatin? no    Do you have a sensitivity to the preservative Thimersol? no    Do you have a past history of Guillan-Barre Syndrome? no    Do you currently have an acute febrile illness? no    Have you ever had a severe reaction to latex? no    Vaccine information given and explained to patient? yes    Are you currently pregnant? no    Lot Number:[id number]   Exp Date:12/22/2009   Site Given  Right Deltoid IM     Lanier Prude, [facility])  March 13, 2010 3:53 PM

## 2010-07-25 NOTE — Progress Notes (Signed)
Summary: VACCINES -Aware MD is out of office until 10/24  Phone Note Call from Patient   Summary of Call: Pt's assistant called. He needs the following vaccines for his trip to [place] per the travel MD his wife has seen and the [employer] for the area of [place] he is going to.   1. Typhoid - advised to check w/health dept 2. Polio booster - advised to check w/health dept 3. Hep A & B - scheduled for first twinrix 10/24. He will do the accelerated dosing schedule so second dose will be given at office visit 7 days later on 10/31. He will then need to schedule third vaccine for week of November 14th.  4. Menactra, reccomended per travel MD wife was seen by, CDC may include this in the "routine" vaccines. Does patient need this?  5 Malaria - will need malaria meds. Per cdc website chloroquine is not affective if traveling to [place]. (please discuss at office visit on 10/31) Initial call taken by: Todd Wolf, CMA,  April 12, 2010 3:45 PM  Follow-up for Phone Call        That is a lot of vaccines. I can talk to Todd Wolf - what # to call? For Malaria prophylaxis we use Malarone ( the problem w/malaria drugs is a frequent liver toxicity - I'm worried. LFTs are better but still elevated. We need to repeat his LFTs 790.5 in 6-8 weeks). He will need to take some Cipro with him just in case.  Follow-up by: Todd Garter MD,  April 18, 2010 7:52 AM  Additional Follow-up for Phone Call Additional follow up Details #1::        He is scheduled for office visit next monday and is planning on discussing everything with you then. If you would like to discuss before the office visit call [phone] (cell) or [phone]  At nurse visit yesterday he told me that he would be in a very nice area and has few concerns. I told him that Hep A & B vaccine is still a good idea. He has no problems with any vaccines that MD advises and will discuss more w/you at office visit.  Additional Follow-up by: Todd Wolf, CMA,   April 18, 2010 9:28 AM    Additional Follow-up for Phone Call Additional follow up Details #2::    Called pt - discussed. Wine - down to 2 glasses/d. If he would take malaria prophylaxis med - no wine. Follow-up by: Todd Garter MD,  April 18, 2010 1:06 PM

## 2010-07-25 NOTE — Assessment & Plan Note (Signed)
Summary: 3rd twinrix/will be here by 4:30/SD  Nurse Visit   Allergies: 1)  ! Kapidex  Immunizations Administered:  TwinRix # 3:    Vaccine Type: TwinRix    Site: left deltoid    Mfr: GlaxoSmithKline    Dose: 0.5 ml    Route: IM    Given by: Ami Bullins CMA    Exp. Date: 03/31/2012    Lot #: [id number]    VIS given: 03/13/07 version given May 10, 2010.  Orders Added: 1)  TwinRix 1ml ( Hep A&B Adult dose) [90636] 2)  Admin 1st Vaccine [90471]

## 2010-07-25 NOTE — Assessment & Plan Note (Signed)
Summary: DR AVP PT-NOT IN CLINIC-BP:170/100-HAND PAIN-STC--ok plot/11:...   Vital Signs:  Patient profile:   76 year old male Height:      71 inches Weight:      211.75 pounds BMI:     29.64 O2 Sat:      97 % on Room air Temp:     98.0 degrees F oral Pulse rate:   65 / minute BP sitting:   144 / 76  (left arm) Cuff size:   regular  Vitals Entered By: Lucious Groves (October 10, 2009 11:46 AM)  O2 Flow:  Room air  Procedure Note  Injections: The patient complains of pain. Duration of symptoms: 4 weeks Indication: acute pain Consent signed: yes  Procedure # 1: joint injection    Region: dorsal    Location: L 1st MCP    Technique: 24 g needle    Medication: 10 mg depomedrol    Anesthesia: 1.0 ml 1% lidocaine w/o epinephrine    Comment: Risks including but not limited by incomplete procedure, bleeding, infection, recurrence were discussed with the patient. Consent form was signed. Tolerated well. Complicatons - none. Good pain relief following the procedure.   Cleaned and prepped with: alcohol and betadine Wound dressing: bandaid  CC: C/O increased BP and flushed feeling./kb Is Patient Diabetic? No Pain Assessment Patient in pain? no        Primary Care Provider:  Trinna Post Plotnikov,MD  CC:  C/O increased BP and flushed feeling./kb.  History of Present Illness: C/o elev BP over past couple wks. He has been under a lot of stress lately. C/o L thumb base pain x wks, no injury  Current Medications (verified): 1)  Lotrel 5-20 Mg  Caps (Amlodipine Besy-Benazepril Hcl) .... Once Daily 2)  Avapro 300 Mg  Tabs (Irbesartan) .... Once Daily 3)  Nexium 40 Mg  Cpdr (Esomeprazole Magnesium) .Marland Kitchen.. 1 By Mouth Bid 4)  Welchol 625 Mg  Tabs (Colesevelam Hcl) .Marland Kitchen.. 1 Two Times A Day 5)  Avodart 0.5 Mg  Caps (Dutasteride) .... Once Daily 6)  Viagra 100 Mg Tabs (Sildenafil Citrate) .Marland Kitchen.. 1 Once Daily Prn 7)  Vitamin D3 1000 Unit  Tabs (Cholecalciferol) .Marland Kitchen.. 1 Qd 8)  Allopurinol 300 Mg Tabs  (Allopurinol) .... Take 1 Tab Every Day  Allergies (verified): 1)  ! Kapidex  Past History:  Past Medical History: Last updated: 02/21/2009 Hyperlipidemia Hypertension Elevated LFTs since the 80's GERD/ Barrett's -- Dr Jarold Motto Benign prostatic hypertrophy Gout, remote Elev. glu Chronic diarrhea  Social History: Last updated: 10/10/2009 Retired Married Never Smoked Alcohol use-yes socially Regular exercise-yes  Illicit Drug Use - no  Social History: Retired Married Never Smoked Alcohol use-yes socially Regular exercise-yes  Illicit Drug Use - no  Review of Systems  The patient denies fever, chest pain, prolonged cough, and abdominal pain.    Physical Exam  General:  Well developed, well nourished, no acute distress.healthy appearing.   Ears:  External ear exam shows no significant lesions or deformities.  Otoscopic examination reveals clear canals, tympanic membranes are intact bilaterally without bulging, retraction, inflammation or discharge. Hearing is grossly normal bilaterally. Nose:  External nasal examination shows no deformity or inflammation. Nasal mucosa are pink and moist without lesions or exudates. Mouth:  Oral mucosa and oropharynx without lesions or exudates.  Teeth in good repair. Lungs:  Normal respiratory effort, chest expands symmetrically. Lungs are clear to auscultation, no crackles or wheezes. Heart:  Normal rate and regular rhythm. S1 and S2 normal without gallop, murmur,  click, rub or other extra sounds. Abdomen:  Bowel sounds positive,abdomen soft and non-tender without masses, organomegaly or hernias noted. Msk:  L 1st MCP is tender Neurologic:  No cranial nerve deficits noted. Station and gait are normal. Plantar reflexes are down-going bilaterally. DTRs are symmetrical throughout. Sensory, motor and coordinative functions appear intact. Skin:  Intact without suspicious lesions or rashes Psych:  Cognition and judgment appear intact. Alert  and cooperative with normal attention span and concentration. No apparent delusions, illusions, hallucinations   Impression & Recommendations:  Problem # 1:  HYPERTENSION (ICD-401.9) Assessment Deteriorated He was stressed out The following medications were removed from the medication list:    Avapro 300 Mg Tabs (Irbesartan) ..... Once daily His updated medication list for this problem includes:    Lotrel 5-20 Mg Caps (Amlodipine besy-benazepril hcl) ..... Once daily    Valturna 150-160 Mg Tabs (Aliskiren-valsartan) .Marland Kitchen... 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 1 by mouth bid  Problem # 4:  STRESS DISORDER (ICD-V62.89) - situational, aggravating HTN Assessment: New Discussed  Complete Medication List: 1)  Lotrel 5-20 Mg Caps (Amlodipine besy-benazepril hcl) .... Once daily 2)  Valturna 150-160 Mg Tabs (Aliskiren-valsartan) .Marland Kitchen.. 1 by mouth once daily for blood pressure 3)  Nexium 40 Mg Cpdr (Esomeprazole magnesium) .Marland Kitchen.. 1 by mouth bid 4)  Welchol 625 Mg Tabs (Colesevelam hcl) .Marland Kitchen.. 1 two times a day 5)  Avodart 0.5 Mg Caps (Dutasteride) .... Once daily 6)  Viagra 100 Mg Tabs (Sildenafil citrate) .Marland Kitchen.. 1 once daily prn 7)  Vitamin D3 1000 Unit Tabs (Cholecalciferol) .Marland Kitchen.. 1 qd 8)  Allopurinol 300 Mg Tabs (Allopurinol) .... Take 1 tab every day 9)  Pennsaid 1.5 % Soln (Diclofenac sodium) .... 3-5 gtt on skin three times a day for pain  Other Orders: Joint Aspirate / Injection, Small (16109) Depo-Medrol 20mg  (J1020)  Patient Instructions: 1)  Stop Avapro. Start Valturna 1 a day instead 2)  Goal BP <135/85 3)  Keep return office visit  Prescriptions: PENNSAID 1.5 % SOLN (DICLOFENAC SODIUM) 3-5 gtt on skin three times a  day for pain  #1 x 3   Entered and Authorized by:   Tresa Garter MD   Signed by:   Tresa Garter MD on 10/10/2009   Method used:   Print then Give to Patient   RxID:   [id number] VALTURNA 150-160 MG TABS (ALISKIREN-VALSARTAN) 1 by mouth once daily for blood pressure  #30 x 12   Entered and Authorized by:   Tresa Garter MD   Signed by:   Tresa Garter MD on 10/10/2009   Method used:   Print then Give to Patient   RxID:   [id number] VALTURNA 150-160 MG TABS (ALISKIREN-VALSARTAN) 1 by mouth once daily for blood pressure  #30 x 12   Entered and Authorized by:   Tresa Garter MD   Signed by:   Tresa Garter MD on 10/10/2009   Method used:   [facility] to        [facility]  [address] [id number]       [address]       [place], [place]  [address]       Ph: [phone] or  [phone]       Fax: [phone]   RxID:   [id number]    Medication Administration  Injection # 1:    Medication: Depo 10mg     Diagnosis: HAND PAIN (ICD-729.5)    Route: ID    Site: thumb    Exp Date: 06/25/2010    Lot #: [id number]    Mfr: [employer]    Comments: MD gave patient injection in thumb./kb    Patient tolerated injection without complications    Given by: Lucious Groves (October 10, 2009 2:49 PM)  Orders Added: 1)  Est. Patient Level IV [32440] 2)  Joint Aspirate / Injection, Small [20600] 3)  Depo-Medrol 20mg  [J1020]

## 2010-07-25 NOTE — Progress Notes (Signed)
Summary: IMMUNIZATIONS  Phone Note Call from Patient   Summary of Call: Recieved call back from [facility] has no immunization records. Chart ordered to see if he had any immunizations from approx 2000 when he went to [place]. Will call Meriam Sprague back Monday (she is out of the office tomorrow)  Initial call taken by: Lamar Sprinkles, CMA,  March 23, 2010 5:10 PM  Follow-up for Phone Call        No immunization history found in chart. left vm for beverly to call back. Follow-up by: Lamar Sprinkles, CMA,  March 28, 2010 6:22 PM

## 2010-07-25 NOTE — Assessment & Plan Note (Signed)
Summary: twinrix/to see sarah/SD  Nurse Visit   Allergies: 1)  ! Kapidex  Immunizations Administered:  TwinRix # 1:    Vaccine Type: TwinRix    Site: right deltoid    Mfr: GlaxoSmithKline    Dose: 0.5 ml    Route: IM    Given by: Lamar Sprinkles, CMA    Exp. Date: 03/31/2012    Lot #: [id number]    VIS given: 03/13/07 version given April 17, 2010.  Orders Added: 1)  TwinRix 1ml ( Hep A&B Adult dose) [90636] 2)  Admin 1st Vaccine [90471]

## 2010-07-25 NOTE — Progress Notes (Signed)
Summary: increased BP  Phone Note Call from Patient Call back at Home Phone [phone]   Caller: [name]--[phone] Summary of Call: Todd Wolf left message on triage that the patient BP is up. She states that the BP this am was 144/100 and the patient feels flushed, but denies HA and blurry vision. Patient is traveling right now and has an appt on Mon. with Jones. Should patient make any med changes between now and then? Initial call taken by: Lucious Groves,  September 29, 2009 1:54 PM  Follow-up for Phone Call        If BP cont to stay up and you think flushing is related to high BP - take an extra Lotrel a day (I called Todd Wolf) I can see him on 4/18 11:45 am pls confirm w/[name] Thx Follow-up by: Tresa Garter MD,  September 30, 2009 7:22 AM  Additional Follow-up for Phone Call Additional follow up Details #1::        Appointment was changed by scheduler Additional Follow-up by: Lamar Sprinkles, CMA,  September 30, 2009 1:22 PM

## 2010-07-25 NOTE — Miscellaneous (Signed)
Summary: [facility] Previsit/prep  Clinical Lists Changes  Observations: Added new observation of ALLERGY REV: Done (07/11/2009 13:39)

## 2010-07-25 NOTE — Miscellaneous (Signed)
Summary: Thumb Inject/Todd Wolf  Thumb Inject/Todd Wolf   Imported By: Sherian Rein 10/12/2009 10:27:45  _____________________________________________________________________  External Attachment:    Type:   Image     Comment:   External Document

## 2010-07-25 NOTE — Procedures (Signed)
Summary: Upper Endoscopy  Patient: Todd Wolf Note: All result statuses are Final unless otherwise noted.  Tests: (1) Upper Endoscopy (EGD)   EGD Upper Endoscopy       DONE     [facility]     [address]     [place], [place]  [address]           ENDOSCOPY PROCEDURE REPORT           PATIENT:  [name], [name]  MR#:  [id number]     BIRTHDATE:  08-04-1934, 74 yrs. old  GENDER:  male           ENDOSCOPIST:  Vania Rea. Jarold Motto, MD, [facility]     Referred by:           PROCEDURE DATE:  07/18/2009     PROCEDURE:  EGD with biopsy     ASA CLASS:  Class II     INDICATIONS:  h/o Barrett's Esophagus           MEDICATIONS:   Fentanyl 50 mcg IV, Versed 5 mg IV, glycopyrrolate     (Robinal) 0.2 mg IV     TOPICAL ANESTHETIC:  Exactacain Spray           DESCRIPTION OF PROCEDURE:   After the risks benefits and     alternatives of the procedure were thoroughly explained, informed     consent was obtained.  The LB GIF-H180 [id number] endoscope was     introduced through the mouth and advanced to the second portion of     the duodenum, without limitations.  The instrument was slowly     withdrawn as the mucosa was fully examined.     <<PROCEDUREIMAGES>>           Barrett's esophagus was found in the distal esophagus. 7 cm     segment biopsied in proximal and distal areas.no inflammmation or     nodular areas noted.  A hiatal hernia was found. 5cm. prolapsing     HH noted and scattered small hyperplastic fundal polyps seen.     Normal duodenal folds were noted.  The stomach was entered and     closely examined. The antrum, angularis, and lesser curvature were     well visualized, including a retroflexed view of the cardia and     fundus. The stomach wall was normally distensable. The scope     passed easily through the pylorus into the duodenum.     Retroflexed views revealed a hiatal hernia.    The scope was then     withdrawn from the patient and the procedure completed.        COMPLICATIONS:  None           ENDOSCOPIC IMPRESSION:     1) Barrett's esophagus in the distal esophagus     2) Hiatal hernia     3) Normal duodenal folds     4) Normal stomach     5) A hiatal hernia     RECOMMENDATIONS:     1) continue PPI     2) await pathology results     bx. every 3 years.           REPEAT EXAM:  No           ______________________________     Vania Rea. Jarold Motto, MD, [facility]           CC:  Zackery Barefoot, MD  n.     eSIGNED:   Vania Rea. Patterson at 07/18/2009 10:47 AM           Marcelline Mates, [phone]  Note: An exclamation mark (!) indicates a result that was not dispersed into the flowsheet. Document Creation Date: 07/18/2009 10:47 AM _______________________________________________________________________  (1) Order result status: Final Collection or observation date-time: 07/18/2009 10:37 Requested date-time:  Receipt date-time:  Reported date-time:  Referring Physician:   Ordering Physician: Sheryn Bison [phone]) Specimen Source:  Source: Launa Grill Order Number: [id number] Lab site:   Appended Document: Upper Endoscopy     Procedures Next Due Date:    EGD: 07/2012

## 2010-07-25 NOTE — Progress Notes (Signed)
Summary: VACCINES  Phone Note Call from Patient   Summary of Call: Informed beverly of date of pt's menactra and pt is due for 3rd twinrix on accelerated schedule as early as 11/14. Scheduled for 11/16 Initial call taken by: Lamar Sprinkles, CMA,  May 04, 2010 11:07 AM

## 2010-07-25 NOTE — Letter (Signed)
Summary: Patient Notice-Barrett's [facility]  [address] [place], [place] [address]   Phone: [phone]  Fax: [phone]        July 20, 2009 MRN: [id number]    [facility] [address] [place], [place]  [address]    Dear Rosanne Ashing,  I am pleased to inform you that the biopsies taken during your recent endoscopic examination did not show any evidence of cancer upon pathologic examination.  However, your biopsies indicate you have a condition known as Barrett's esophagus. While not cancer, it is pre-cancerous (can progress to cancer) and needs to be monitored with repeat endoscopic examination and biopsies.THE BIOPSIES SHOW NO DYSPLASIA.PLEASE CONTINUE YOUR NEXIUM, AND IT WAS GREAT TO SEE YOU AGAIN, AND I AM GLAD I CAN BE INVOLVED IN YOUR CARE.BEST WISHES...DAVID Fortunately, it is quite rare that this develops into cancer, but careful monitoring of the condition along with taking your medication as prescribed is important in reducing the risk of developing cancer.  It is my recommendation that you have a repeat upper gastrointestinal endoscopic examination in 3_ years.  Additional information/recommendations:  __Please call [phone] to schedule a return visit to further      evaluate your condition.  _X_Continue with treatment plan as outlined the day of your exam.  Please call us if you have or develop heartburn, reflux symptoms, any swallowing problems, or if you have questions about your condition that have not been fully answered at this time.  Sincerely,  Mardella Layman MD [facility]  This letter has been electronically signed by your physician.  Appended Document: Patient Notice-Barrett's Esopghagus Letter mailed 1.28.11

## 2010-07-25 NOTE — Assessment & Plan Note (Signed)
Summary: 6 WK ROV /NWS   Vital Signs:  Patient profile:   75 year old male Height:      71 inches Weight:      210 pounds BMI:     29.39 Temp:     98.3 degrees F oral Pulse rate:   76 / minute Pulse rhythm:   regular Resp:     16 per minute BP sitting:   140 / 80  (left arm) Cuff size:   regular  Vitals Entered By: Lanier Prude, [name] Gust) (April 24, 2010 2:51 PM) CC: 6 wk f/u Is Patient Diabetic? No Comments pt is not taking Viagra. Please remove from list   Primary Care Provider:  Trinna Post Plotnikov,MD  CC:  6 wk f/u.  History of Present Illness: F/u of elev LFTs, gout Travel to [place] Dec 6th x 2 wks is planned ([facility]  Allergies: 1)  ! Kapidex  Past History:  Past Medical History: Last updated: 02/21/2009 Hyperlipidemia Hypertension Elevated LFTs since the 80's GERD/ Barrett's -- Dr Jarold Motto Benign prostatic hypertrophy Gout, remote Elev. glu Chronic diarrhea  Social History: Last updated: 03/13/2010 Retired Married Never Smoked Alcohol use-yes socially wine 3/d Regular exercise-yes  Illicit Drug Use - no  Review of Systems  The patient denies anorexia, fever, abdominal pain, and severe indigestion/heartburn.    Physical Exam  General:  Well developed, well nourished, no acute distress.healthy appearing.   Mouth:  Oral mucosa and oropharynx without lesions or exudates.  Teeth in good repair. Lungs:  Normal respiratory effort, chest expands symmetrically. Lungs are clear to auscultation, no crackles or wheezes. Heart:  Normal rate and regular rhythm. S1 and S2 normal without gallop, murmur, click, rub or other extra sounds. Abdomen:  Bowel sounds positive,abdomen soft and non-tender without masses, organomegaly or hernias noted. Skin:  Intact without suspicious lesions or rashes Psych:  Cognition and judgment appear intact. Alert and cooperative with normal attention span and concentration. No apparent delusions,  illusions, hallucinations   Impression & Recommendations:  Problem # 1:  ABNORMAL LIVER FUNCTION TESTS (ICD-794.8) Assessment Improved He reduced wine to wknds only. Repeat LFTs in Dec The labs were reviewed with the patient.   Problem # 2:  Travel to [place] Assessment: New Recieved shots today. [facility]one  Rx  Problem # 3:  GOUT (ICD-274.9) Assessment: Improved Check Uric acid level in dec His updated medication list for this problem includes:    Allopurinol 100 Mg Tabs (Allopurinol) .Marland Kitchen... 1 by mouth once daily for gout prevention  Complete Medication List: 1)  Lotrel 5-20 Mg Caps (Amlodipine besy-benazepril hcl) .... Once daily 2)  Valturna 150-160 Mg Tabs (Aliskiren-valsartan) .Marland Kitchen.. 1 by mouth once daily for blood pressure 3)  Nexium 40 Mg Cpdr (Esomeprazole magnesium) .Marland Kitchen.. 1 by mouth bid 4)  Welchol 625 Mg Tabs (Colesevelam hcl) .Marland Kitchen.. 1 two times a day 5)  Avodart 0.5 Mg Caps (Dutasteride) .... Once daily 6)  Viagra 100 Mg Tabs (Sildenafil citrate) .Marland Kitchen.. 1 once daily prn 7)  Vitamin D3 1000 Unit Tabs (Cholecalciferol) .Marland Kitchen.. 1 qd 8)  Pennsaid 1.5 % Soln (Diclofenac sodium) .... 3-5 gtt on skin three times a day for pain 9)  Promethazine Hcl 25 Mg Tabs (Promethazine hcl) .Marland Kitchen.. 1-2 by mouth four times a day as needed nausea 10)  Lomotil 2.5-0.025 Mg Tabs (Diphenoxylate-atropine) .Marland Kitchen.. 1-2 by mouth qid as needed diarrhea 11)  Allopurinol 100 Mg Tabs (Allopurinol) .Marland KitchenMarland KitchenMarland Kitchen  1 by mouth once daily for gout prevention 12)  Malarone 250-100 Mg Tabs (Atovaquone-proguanil hcl) .Marland Kitchen.. 1 by mouth once daily  start 1 day prior to departure, continue daily while in the country and for 7 days after arrival  Other Orders: Menactra IM ([address]) Admin 1st Vaccine (60454) TwinRix 1ml ( Hep A&B Adult dose) (09811) Admin of Any Addtl Vaccine (91478)  Patient Instructions: 1)  Please schedule a follow-up appointment in 6 months. 2)  Hepatic Panel in Dec 2011, ICD-9:  790.5 3)  uric acid 274.9 Prescriptions: MALARONE 250-100 MG TABS (ATOVAQUONE-PROGUANIL HCL) 1 by mouth once daily  start 1 day prior to departure, continue daily while in the country and for 7 days after arrival  #23 x 1   Entered and Authorized by:   Tresa Garter MD   Signed by:   Tresa Garter MD on 04/24/2010   Method used:   Electronically to        [facility] [address] [id number]       [address]       [place], [place]  [address]       Ph: [phone] or [phone]       Fax: [phone]   RxID:   [id number]    Orders Added: 1)  Menactra IM [90734] 2)  Admin 1st Vaccine [90471] 3)  TwinRix 1ml ( Hep A&B Adult dose) [90636] 4)  Admin of Any Addtl Vaccine [90472] 5)  Est. Patient Level IV [87564]   Immunization History:  Pneumovax Immunization History:    Pneumovax:  historical (02/03/2004)  Immunizations Administered:  Meningococcal Vaccine:    Vaccine Type: Menactra    Site: left deltoid    Mfr: Sanofi Pasteur    Dose: 0.5 ml    Route: IM    Given by: Lanier Prude, CMA(AAMA)    Exp. Date: 04/29/2011    Lot #: [id number]    VIS given: 07/22/06 version given April 24, 2010.  TwinRix # 2:    Vaccine Type: TwinRix    Site: right deltoid    Mfr: GlaxoSmithKline    Dose: 0.5 ml    Route: IM    Given by: Lanier Prude, CMA(AAMA)    Exp. Date: 03/31/2012    Lot #: [id number]    VIS given: 03/13/07 version given April 24, 2010.   Immunization History:  Pneumovax Immunization History:    Pneumovax:  Historical (02/03/2004)  Immunizations Administered:  Meningococcal Vaccine:    Vaccine Type: Menactra    Site: left deltoid    Mfr: Sanofi Pasteur    Dose: 0.5 ml    Route: IM    Given by: Lanier Prude, CMA(AAMA)    Exp. Date: 04/29/2011    Lot #: [id number]    VIS given: 07/22/06 version given April 24, 2010.  TwinRix # 2:    Vaccine Type: TwinRix    Site: right deltoid    Mfr: GlaxoSmithKline    Dose: 0.5 ml    Route:  IM    Given by: Lanier Prude, CMA(AAMA)    Exp. Date: 03/31/2012    Lot #: [id number]    VIS given: 03/13/07 version given April 24, 2010.

## 2010-07-25 NOTE — Letter (Signed)
Summary: EGD Instructions  [facility]  [address] [place], [place] [address]   Phone: [phone]  Fax: [phone]       Todd Wolf    [birth year]    MRN: [id number]       Procedure Day /Date:_1/24/11   Monday     Arrival Time: _9:00am       Procedure Time:_10:00am       Location of Procedure:                    _ x _ [facility] (4th Floor)  PREPARATION FOR ENDOSCOPY   On_1/24/11  Monday   THE DAY OF THE PROCEDURE:  1.   No solid foods, milk or milk products are allowed after midnight the night before your procedure.  2.   Do not drink anything colored red or purple.  Avoid juices with pulp.  No orange juice.  3.  You may drink clear liquids until  8:00am, which is 2 hours before your procedure.                                                                                                CLEAR LIQUIDS INCLUDE: Water Jello Ice Popsicles Tea (sugar ok, no milk/cream) Powdered fruit flavored drinks Coffee (sugar ok, no milk/cream) Gatorade Juice: apple, white grape, white cranberry  Lemonade Clear bullion, consomm, broth Carbonated beverages (any kind) Strained chicken noodle soup Hard Candy   MEDICATION INSTRUCTIONS  Unless otherwise instructed, you should take regular prescription medications with a small sip of water as early as possible the morning of your procedure.         OTHER INSTRUCTIONS  You will need a responsible adult at least 74 years of age to accompany you and drive you home.   This person must remain in the waiting room during your procedure.  Wear loose fitting clothing that is easily removed.  Leave jewelry and other valuables at home.  However, you may wish to bring a book to read or an iPod/MP3 player to listen to music as you wait for your procedure to start.  Remove all body piercing jewelry and leave at home.  Total time from sign-in until discharge is approximately 2-3 hours.  You should go home  directly after your procedure and rest.  You can resume normal activities the day after your procedure.  The day of your procedure you should not:   Drive   Make legal decisions   Operate machinery   Drink alcohol   Return to work  You will receive specific instructions about eating, activities and medications before you leave.    The above instructions have been reviewed and explained to me by  Wyona Almas RN  July 11, 2009 2:38 PM    I fully understand and can verbalize these instructions _____________________________ Date _________

## 2010-07-25 NOTE — Progress Notes (Signed)
Summary: Immunization  Phone Note Call from Patient   Caller: Meriam Sprague Summary of Call: Pt needs immunization report. Left mess for patient that we could mail or fax report or she can call back for verbal.  Initial call taken by: Lamar Sprinkles, CMA,  March 23, 2010 1:26 PM  Follow-up for Phone Call        Spoke w/beverly. Pt is going out of the country in December. He will need hepatitis vaccines and yellow fever. Advised her that we didnot have these records. She will check with health dept first then call our office back if still needs vaccines. Also suggested twinrix accelerated dosing schedule to get hepatitis if needed.  Follow-up by: Lamar Sprinkles, CMA,  March 23, 2010 4:04 PM

## 2010-07-27 NOTE — Progress Notes (Signed)
Summary:  Todd Wolf changed   Phone Note From Pharmacy   Summary of Call: PA-Valturna , [facility] longer wants to cover pt must try and fail step therepy. Initial call taken by: Dagoberto Reef,  June 08, 2010 4:39 PM  Follow-up for Phone Call        He has failed Avapro, Amlodipine, Benazepril. Unable to use HCTZ due to gout Thank you!  Follow-up by: Tresa Garter MD,  June 09, 2010 12:37 PM  Additional Follow-up for Phone Call Additional follow up Details #1::        Will d/c [facility] 40 and norvasc 5 Additional Follow-up by: Tresa Garter MD,  June 14, 2010 8:36 AM    Additional Follow-up for Phone Call Additional follow up Details #2::    left mess for pt to call back   pt left message on triage A returning your call..........Marland KitchenBrenton Grills CMA Duncan Dull)  June 15, 2010 9:54 AM  Follow-up by: Lanier Prude, [facility]),  June 15, 2010 9:13 AM  Additional Follow-up for Phone Call Additional follow up Details #3:: Details for Additional Follow-up Action Taken: pt informed pf above Additional Follow-up by: Lanier Prude, [facility]),  June 22, 2010 4:24 PM  New/Updated Medications: AMLODIPINE BESYLATE 5 MG TABS (AMLODIPINE BESYLATE) 1 by mouth qd BENAZEPRIL HCL 40 MG TABS (BENAZEPRIL HCL) 1 by mouth once daily for blood pressure ULORIC 40 MG TABS (FEBUXOSTAT) 1 by mouth qd Prescriptions: ULORIC 40 MG TABS (FEBUXOSTAT) 1 by mouth qd  #30 x 11   Entered by:   Tresa Garter MD   Authorized by:   Dagoberto Reef   Signed by:   Tresa Garter MD on 06/14/2010   Method used:   Print then Give to Patient   RxID:   [id number] BENAZEPRIL HCL 40 MG TABS (BENAZEPRIL HCL) 1 by mouth once daily for blood pressure  #30 x 11   Entered by:   Tresa Garter MD   Authorized by:   Dagoberto Reef   Signed by:   Tresa Garter MD on 06/14/2010   Method used:   Print then Give to Patient   RxID:    [id number] AMLODIPINE BESYLATE 5 MG TABS (AMLODIPINE BESYLATE) 1 by mouth qd  #30 x 12   Entered by:   Tresa Garter MD   Authorized by:   Dagoberto Reef   Signed by:   Tresa Garter MD on 06/14/2010   Method used:   Print then Give to Patient   RxID:   [id number]

## 2010-08-15 ENCOUNTER — Ambulatory Visit: Payer: Self-pay | Admitting: Internal Medicine

## 2010-08-15 ENCOUNTER — Telehealth: Payer: Self-pay | Admitting: Internal Medicine

## 2010-08-16 ENCOUNTER — Ambulatory Visit: Payer: Self-pay | Admitting: Internal Medicine

## 2010-08-17 ENCOUNTER — Other Ambulatory Visit: Payer: PRIVATE HEALTH INSURANCE

## 2010-08-17 ENCOUNTER — Other Ambulatory Visit: Payer: Self-pay | Admitting: Internal Medicine

## 2010-08-17 ENCOUNTER — Ambulatory Visit (INDEPENDENT_AMBULATORY_CARE_PROVIDER_SITE_OTHER): Payer: PRIVATE HEALTH INSURANCE | Admitting: Internal Medicine

## 2010-08-17 ENCOUNTER — Ambulatory Visit (INDEPENDENT_AMBULATORY_CARE_PROVIDER_SITE_OTHER)
Admission: RE | Admit: 2010-08-17 | Discharge: 2010-08-17 | Disposition: A | Discharge status: Home / Self Care | Payer: PRIVATE HEALTH INSURANCE | Source: Ambulatory Visit | Attending: Internal Medicine | Admitting: Internal Medicine

## 2010-08-17 ENCOUNTER — Encounter: Payer: Self-pay | Admitting: Internal Medicine

## 2010-08-17 DIAGNOSIS — M545 Low back pain, unspecified: Secondary | ICD-10-CM

## 2010-08-17 DIAGNOSIS — R141 Gas pain: Secondary | ICD-10-CM

## 2010-08-17 DIAGNOSIS — R143 Flatulence: Secondary | ICD-10-CM

## 2010-08-17 DIAGNOSIS — I10 Essential (primary) hypertension: Secondary | ICD-10-CM

## 2010-08-17 DIAGNOSIS — R945 Abnormal results of liver function studies: Secondary | ICD-10-CM

## 2010-08-17 DIAGNOSIS — R142 Eructation: Secondary | ICD-10-CM

## 2010-08-17 HISTORY — DX: Gas pain: R14.3

## 2010-08-17 HISTORY — DX: Low back pain, unspecified: M54.50

## 2010-08-17 HISTORY — DX: Flatulence: R14.1

## 2010-08-17 LAB — BASIC METABOLIC PANEL
BUN: 21 mg/dL (ref 6–23)
CO2: 31 mEq/L (ref 19–32)
Calcium: 9.5 mg/dL (ref 8.4–10.5)
Chloride: 104 mEq/L (ref 96–112)
Creatinine, Ser: 1 mg/dL (ref 0.4–1.5)
Glucose, Bld: 91 mg/dL (ref 70–99)
Potassium: 4.6 mEq/L (ref 3.5–5.1)
Sodium: 141 mEq/L (ref 135–145)

## 2010-08-17 LAB — CBC WITH DIFFERENTIAL/PLATELET
Basophils Absolute: 0 10*3/uL (ref 0.0–0.1)
Basophils Relative: 0.4 % (ref 0.0–3.0)
Eosinophils Absolute: 0.2 10*3/uL (ref 0.0–0.7)
Eosinophils Relative: 2.5 % (ref 0.0–5.0)
HCT: 44.4 % (ref 39.0–52.0)
Hemoglobin: 15.3 g/dL (ref 13.0–17.0)
Lymphs Abs: 1.7 10*3/uL (ref 0.7–4.0)
MCV: 93.6 fl (ref 78.0–100.0)
Monocytes Absolute: 0.7 10*3/uL (ref 0.1–1.0)
Monocytes Relative: 8.1 % (ref 3.0–12.0)
Neutro Abs: 5.6 10*3/uL (ref 1.4–7.7)
Neutrophils Relative %: 68.7 % (ref 43.0–77.0)
Platelets: 203 10*3/uL (ref 150.0–400.0)
RBC: 4.74 Mil/uL (ref 4.22–5.81)
WBC: 8.2 10*3/uL (ref 4.5–10.5)

## 2010-08-17 LAB — HEPATIC FUNCTION PANEL
ALT: 58 U/L — ABNORMAL HIGH (ref 0–53)
Albumin: 4.5 g/dL (ref 3.5–5.2)
Total Bilirubin: 1.3 mg/dL — ABNORMAL HIGH (ref 0.3–1.2)
Total Protein: 6.8 g/dL (ref 6.0–8.3)

## 2010-08-17 LAB — SEDIMENTATION RATE: Sed Rate: 6 mm/hr (ref 0–22)

## 2010-08-17 LAB — URINALYSIS
Bilirubin Urine: NEGATIVE
Hgb urine dipstick: NEGATIVE
Ketones, ur: NEGATIVE
Leukocytes, UA: NEGATIVE
Nitrite: NEGATIVE
Specific Gravity, Urine: <=1.005 (ref 1.000–1.030)
Total Protein, Urine: NEGATIVE
Urine Glucose: NEGATIVE
Urobilinogen, UA: 0.2 (ref 0.0–1.0)

## 2010-08-17 LAB — URIC ACID: Uric Acid, Serum: 5.4 mg/dL (ref 4.0–7.8)

## 2010-08-21 ENCOUNTER — Other Ambulatory Visit: Payer: Self-pay | Admitting: Internal Medicine

## 2010-08-21 DIAGNOSIS — M545 Low back pain: Secondary | ICD-10-CM

## 2010-08-21 DIAGNOSIS — R142 Eructation: Secondary | ICD-10-CM

## 2010-08-21 DIAGNOSIS — R141 Gas pain: Secondary | ICD-10-CM

## 2010-08-22 NOTE — Progress Notes (Signed)
Summary: WORK IN APT  Phone Note Call from Patient Call back at Home Phone [phone]   Caller: Meriam Sprague - Pt's assistant Summary of Call: Pt c/o back pain x 1 wk and req wk in apt w/Dr Suzan Garibaldi.  Initial call taken by: Lamar Sprinkles, CMA,  August 15, 2010 10:07 AM  Follow-up for Phone Call        OK PER MD - wk in Wednesday. Left vm for beverly and pt to call office for work in apt.  Follow-up by: Lamar Sprinkles, CMA,  August 15, 2010 6:46 PM  Additional Follow-up for Phone Call Additional follow up Details #1::        pt coming 11:30am this am. Additional Follow-up by: Lanier Prude, [facility]),  August 16, 2010 8:48 AM

## 2010-08-22 NOTE — Assessment & Plan Note (Signed)
Summary: BACK PAIN/ WORK IN PER AVP /NWS   Vital Signs:  Patient profile:   76 year old male Height:      71 inches Weight:      210 pounds BMI:     29.39 Temp:     98.4 degrees F oral Pulse rate:   80 / minute Pulse rhythm:   regular Resp:     16 per minute BP sitting:   130 / 74  (left arm) Cuff size:   regular  Vitals Entered By: Lanier Prude, CMA(AAMA) (August 17, 2010 11:53 AM) CC: back pain X 1 wk Is Patient Diabetic? No   Primary Care Provider:  Trinna Post Plotnikov,MD  CC:  back pain X 1 wk.  History of Present Illness: C/o LBP L 4-5/10 flank pain better w/rest or heat.  Better at night. He felt bloated today.  He was bloated this am. Pain has possible started after he worked on his fence picking up wood pieces  Current Medications (verified): 1)  Nexium 40 Mg  Cpdr (Esomeprazole Magnesium) .Marland Kitchen.. 1 By Mouth Bid 2)  Welchol 625 Mg  Tabs (Colesevelam Hcl) .Marland Kitchen.. 1 Two Times A Day 3)  Avodart 0.5 Mg  Caps (Dutasteride) .... Once Daily 4)  Viagra 100 Mg Tabs (Sildenafil Citrate) .Marland Kitchen.. 1 Once Daily Prn 5)  Vitamin D3 1000 Unit  Tabs (Cholecalciferol) .Marland Kitchen.. 1 Qd 6)  Pennsaid 1.5 % Soln (Diclofenac Sodium) .... 3-5 Gtt On Skin Three Times A Day For Pain 7)  Promethazine Hcl 25 Mg Tabs (Promethazine Hcl) .Marland Kitchen.. 1-2 By Mouth Four Times A Day As Needed Nausea 8)  Lomotil 2.5-0.025 Mg Tabs (Diphenoxylate-Atropine) .Marland Kitchen.. 1-2 By Mouth Qid As Needed Diarrhea 9)  Amlodipine Besylate 5 Mg Tabs (Amlodipine Besylate) .Marland Kitchen.. 1 By Mouth Qd 10)  Benazepril Hcl 40 Mg Tabs (Benazepril Hcl) .Marland Kitchen.. 1 By Mouth Once Daily For Blood Pressure 11)  Uloric 40 Mg Tabs (Febuxostat) .Marland Kitchen.. 1 By Mouth Qd  Allergies (verified): 1)  ! Kapidex  Past History:  Past Medical History: Last updated: 02/21/2009 Hyperlipidemia Hypertension Elevated LFTs since the 80's GERD/ Barrett's -- Dr Jarold Motto Benign prostatic hypertrophy Gout, remote Elev. glu Chronic diarrhea  Social History: Last updated:  03/13/2010 Retired Married Never Smoked Alcohol use-yes socially wine 3/d Regular exercise-yes  Illicit Drug Use - no  Social History: Reviewed history from 03/13/2010 and no changes required. Retired Married Never Smoked Alcohol use-yes socially wine 3/d Regular exercise-yes  Illicit Drug Use - no  Physical Exam  General:  Well developed, well nourished, no acute distress.healthy appearing.   Nose:  External nasal examination shows no deformity or inflammation. Nasal mucosa are pink and moist without lesions or exudates. Mouth:  Oral mucosa and oropharynx without lesions or exudates.  Teeth in good repair. Lungs:  Normal respiratory effort, chest expands symmetrically. Lungs are clear to auscultation, no crackles or wheezes. Heart:  Normal rate and regular rhythm. S1 and S2 normal without gallop, murmur, click, rub or other extra sounds. Abdomen:  Bowel sounds positive,abdomen soft and non-tender without masses, organomegaly or hernias noted. RLQ is a little sensitive Prostate:  no nodules and 1+ enlarged.   Msk:  WNL Extremities:  No clubbing, cyanosis, edema, or deformity noted with normal full range of motion of all joints.   Neurologic:  No cranial nerve deficits noted. Station and gait are normal. Plantar reflexes are down-going bilaterally. DTRs are symmetrical throughout. Sensory, motor and coordinative functions appear intact. Str leg elev is neg B Skin:  Intact without suspicious lesions or rashes   Impression & Recommendations:  Problem # 1:  LOW BACK PAIN, ACUTE (ICD-724.2) L - likely MSK Assessment New  Orders: T-Lumbar Spine 2 Views (72100TC) Radiology Referral (Radiology) TLB-BMP (Basic Metabolic Panel-BMET) (80048-METABOL) TLB-CBC Platelet - w/Differential (85025-CBCD) TLB-Hepatic/Liver Function Pnl (80076-HEPATIC) TLB-Sedimentation Rate (ESR) (85652-ESR) TLB-Uric Acid, Blood (84550-URIC) TLB-Udip ONLY (81003-UDIP)  His updated medication list for this  problem includes:    Tramadol Hcl 50 Mg Tabs (Tramadol hcl) .Marland Kitchen... 1-2 tabs by mouth two times a day as needed pain  Problem # 2:  ABDOMINAL BLOATING (ICD-787.3) ? etiol Assessment: New  Orders: TLB-BMP (Basic Metabolic Panel-BMET) (80048-METABOL) TLB-CBC Platelet - w/Differential (85025-CBCD) TLB-Hepatic/Liver Function Pnl (80076-HEPATIC) TLB-Sedimentation Rate (ESR) (85652-ESR) TLB-Uric Acid, Blood (84550-URIC) TLB-Udip ONLY (81003-UDIP) Radiology Referral (Radiology)  Problem # 3:  HYPERTENSION (ICD-401.9) Assessment: Improved  His updated medication list for this problem includes:    Amlodipine Besylate 5 Mg Tabs (Amlodipine besylate) .Marland Kitchen... 1 by mouth qd    Benazepril Hcl 40 Mg Tabs (Benazepril hcl) .Marland Kitchen... 1 by mouth once daily for blood pressure  Orders: TLB-BMP (Basic Metabolic Panel-BMET) (80048-METABOL) TLB-CBC Platelet - w/Differential (85025-CBCD) TLB-Hepatic/Liver Function Pnl (80076-HEPATIC) TLB-Sedimentation Rate (ESR) (85652-ESR) TLB-Uric Acid, Blood (84550-URIC) TLB-Udip ONLY (81003-UDIP)  Problem # 4:  ABNORMAL LIVER FUNCTION TESTS (ICD-794.8) Assessment: Improved  Orders: TLB-BMP (Basic Metabolic Panel-BMET) (80048-METABOL) TLB-CBC Platelet - w/Differential (85025-CBCD) TLB-Hepatic/Liver Function Pnl (80076-HEPATIC) TLB-Sedimentation Rate (ESR) (85652-ESR) TLB-Uric Acid, Blood (84550-URIC) TLB-Udip ONLY (81003-UDIP)  Advised patient to avoid alcohol and Tylenol. Call for worsening symptoms.   Complete Medication List: 1)  Nexium 40 Mg Cpdr (Esomeprazole magnesium) .Marland Kitchen.. 1 by mouth bid 2)  Welchol 625 Mg Tabs (Colesevelam hcl) .Marland Kitchen.. 1 two times a day 3)  Avodart 0.5 Mg Caps (Dutasteride) .... Once daily 4)  Viagra 100 Mg Tabs (Sildenafil citrate) .Marland Kitchen.. 1 once daily prn 5)  Vitamin D3 1000 Unit Tabs (Cholecalciferol) .Marland Kitchen.. 1 qd 6)  Pennsaid 1.5 % Soln (Diclofenac sodium) .... 3-5 gtt on skin three times a day for pain 7)  Promethazine Hcl 25 Mg Tabs  (Promethazine hcl) .Marland Kitchen.. 1-2 by mouth four times a day as needed nausea 8)  Lomotil 2.5-0.025 Mg Tabs (Diphenoxylate-atropine) .Marland Kitchen.. 1-2 by mouth qid as needed diarrhea 9)  Amlodipine Besylate 5 Mg Tabs (Amlodipine besylate) .Marland Kitchen.. 1 by mouth qd 10)  Benazepril Hcl 40 Mg Tabs (Benazepril hcl) .Marland Kitchen.. 1 by mouth once daily for blood pressure 11)  Uloric 40 Mg Tabs (Febuxostat) .Marland Kitchen.. 1 by mouth qd 12)  Tramadol Hcl 50 Mg Tabs (Tramadol hcl) .Marland Kitchen.. 1-2 tabs by mouth two times a day as needed pain  Patient Instructions: 1)  Call if you are not better in a reasonable amount of time or if worse. Go to ER if feeling really bad!  Prescriptions: TRAMADOL HCL 50 MG TABS (TRAMADOL HCL) 1-2 tabs by mouth two times a day as needed pain  #60 x 1   Entered and Authorized by:   Tresa Garter MD   Signed by:   Tresa Garter MD on 08/17/2010   Method used:   Print then Give to Patient   RxID:   [id number]    Orders Added: 1)  T-Lumbar Spine 2 Views [72100TC] 2)  TLB-BMP (Basic Metabolic Panel-BMET) [80048-METABOL] 3)  TLB-CBC Platelet - w/Differential [85025-CBCD] 4)  TLB-Hepatic/Liver Function Pnl [80076-HEPATIC] 5)  TLB-Sedimentation Rate (ESR) [85652-ESR] 6)  TLB-Uric Acid, Blood [84550-URIC] 7)  TLB-Udip ONLY [81003-UDIP] 8)  Radiology Referral [Radiology] 9)  Est. Patient Level IV [id number]

## 2010-08-28 ENCOUNTER — Ambulatory Visit
Admission: RE | Admit: 2010-08-28 | Discharge: 2010-08-28 | Disposition: A | Discharge status: Home / Self Care | Payer: PRIVATE HEALTH INSURANCE | Source: Ambulatory Visit | Attending: Internal Medicine | Admitting: Internal Medicine

## 2010-08-28 DIAGNOSIS — R141 Gas pain: Secondary | ICD-10-CM

## 2010-08-28 DIAGNOSIS — M545 Low back pain: Secondary | ICD-10-CM

## 2010-11-07 NOTE — Assessment & Plan Note (Signed)
[facility]                           PRIMARY CARE OFFICE NOTE   NAME:Todd Wolf, Todd Wolf                       MRN:          [id number]  DATE:01/23/2007                            DOB:          27-Dec-1934    The patient is a 76 year old male who presents for a wellness  examination.   PAST MEDICAL HISTORY:  1. Hypertension.  2. Chronically elevated liver function tests.  3. Barrett's esophagitis.  4. Dyslipidemia.  5. Chronic diarrhea, resolved on Welchol.  6. BPH.  7. ED.   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  Travels a lot.  Does not smoke.  Wine occasionally.  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   FAMILY HISTORY:  Unchanged from December 01, 1997.   CURRENT MEDICINES:  1. Lotrel 5/20 mg daily.  2. Avapro 300 mg daily.  3. Welchol one twice a day.  4. Nexium 40 mg every morning.  5. Avodart one daily, although presently discontinued.  6. Viagra p.r.n.   REVIEW OF SYSTEMS:  No chest pain or shortness of breath.  No exertional  symptoms.  No trouble swallowing.  No headaches.  No TIA-like symptoms.  No problems with arthritis.  The rest of the 18-point review of systems  is negative.   PHYSICAL:  Blood pressure 140/88, pulse 82, temperature 97.6, weight  260.6 pounds.  He looks well, younger than his stated age.  HEENT:  With moist mucosa.  NECK:  Supple.  No thyromegaly or bruit.  LUNGS:  Clear to auscultation and percussion.  No wheezes or rales.  CARDIAC:  S1-S2, no murmur, no gallop.  ABDOMEN:  Soft, nontender.  No organomegaly or mass felt.  Lower extremities without edema.  He is alert and cooperative.  Denies being depressed.  RECTAL:  Normal.  Prostate:  No nodules, no masses.  Stool guaiac-  negative.  External genitalia normal.  No hernias.  SKIN:  Age-related pigmented moles, overall unremarkable.  A slight  growth, probably 6 mm, over posterior left ear, with palpation  appears  to be a cyst.   Labs on January 15, 2007:  CBC normal.  Glucose 102.  Total bilirubin 1.3,  alkaline phosphatase 127, AST 31, ALT 48.  Cholesterol 209,  triglycerides 184, LDL 109.  TSH normal.  PSA 1.0.  Urinalysis normal.   ASSESSMENT AND PLAN:  1. Normal wellness examination.  Age/health related issues discussed.      Healthy lifestyle discussed.  EKG today appeared normal.  Will      obtain chest x-ray.  All vaccinations are up to date.  We will      repeat the exam in 12 months.  2. Pigmented skin lesions.  He will see his dermatologist yearly.  He      may need to see an ear, nose and throat doctor regarding the cyst      behind the left ear.  3. Barrett's esophagitis.  He will start seeing Dr. Jarold Motto for      regular endoscopy visits.  Todd Quint. Plotnikov, MD  Electronically Signed    AVP/MedQ  DD: 01/27/2007  DT: 01/27/2007  Job #: [id number]   cc:   Vania Rea. Jarold Motto, MD, Caleen Essex, FAGA

## 2010-11-10 NOTE — Op Note (Signed)
[facility]  Patient:    Todd Wolf, Todd Wolf                     MRN: [id number] Proc. Date: 10/31/99 Adm. Date:  [id number] Disc. Date: [id number] Attending:  Meredith Leeds                           Operative Report  PREOPERATIVE DIAGNOSIS:  Umbilical hernia.  POSTOPERATIVE DIAGNOSIS:  Umbilical hernia.  OPERATION:  Repair of umbilical hernia.  SURGEON:  Zigmund Daniel, M.D.  ANESTHESIA: General.  DESCRIPTION OF PROCEDURE:  After adequate general anesthesia and routine preparation and draping of the abdomen, I made a short, slightly downward curved but transversely oriented supraumbilical incision after thoroughly infiltrating the umbilical area with 0.25% bupivacaine with epinephrine.  I got hemostasis throughout the operation with the Bovie.  I dissected the hernia site and fatty contents away from the umbilical skin and surrounding normal fat and dissected it down to the midline fascia.  There was a single hernia defect.  I enlarged it slightly so I could reduce the fat, and I reduced the fat within the preperitoneal space.  I then spread out a piece of Marlex mesh underneath the fascia and closed the defect with a running 2-0 Prolene suture incorporating the mid part of the mesh into the closure.  I freed up the overlying fat and soft tissue just a little bit in order to make sure there was no tension on the closure.  Hemostasis was good.  I sewed the umbilical skin down to the fascia with two sutures of 4-0 Vicryl, and I closed the skin with a running intracuticular 4-0 Vicryl reinforced by Steri-Strips. I applied a slightly compressive bandage.  The patient tolerated the operation well. DD:  10/31/99 TD:  11/01/99 Job: [id number]

## 2010-11-10 NOTE — Assessment & Plan Note (Signed)
[facility]                             PRIMARY CARE OFFICE NOTE   NAME:Todd Wolf, Todd Wolf                       MRN:          [id number]  DATE:01/16/2006                            DOB:          [birth year]    The patient is a 76 year old who presents for wellness examination.   ALLERGIES:  NO KNOWN DRUG ALLERGIES.   Past medical history, family history, social history, as per January 15, 2005  note.   CURRENT MEDICINES:  Reviewed.  He is on Avodart, Welchol, Nexium, Lotrel  5/20, vitamin, p.r.n. Viagra.   REVIEW OF SYSTEMS:  No musculoskeletal complaints now.  No shoulder or hip  pain.  No problems with indigestion.  No weight loss or weight.  No syncopal  spells.  No chest pain with a fair amount of physical activity.  The rest is  negative.   PHYSICAL EXAMINATION:  VITAL SIGNS:  Blood pressure 143/86, pulse 64, temp  98.3, weight 205 pounds.  HEENT:  Moist mucosa.  NECK:  Supple.  No thyromegaly or bruits.  LUNGS:  Clear.  No wheezes or rales.  HEART:  S1 S2.  No murmur, no gallop.  ABDOMEN:  Soft nontender.  No organomegaly or masses felt.  LOWER EXTREMITIES:  Without edema.  NEUROLOGIC:  He is alert and cooperative.  Not depressed.  RECTAL:  Not done due to upcoming colonoscopy.  SKIN:  With aging changes.   LABORATORY:  EKG with normal sinus rhythm.  Lab work, on January 08, 2006,  included a normal CBC. Glucose 105.  Labs reviewed with the patient.   ASSESSMENT/PLAN:  1.  A normal well examination.  Age/health related issues discussed.      Healthy lifestyle discussed.  Repeat exam in 12 months.  2.  Dyslipidemia.  Doing well on Welchol.  Continue current therapy.  3.  Hypertension, reasonably well controlled.  Continue current therapy.  4.  Chronic diarrhea, resolved on Welchol.  5.  Barrett esophagitis on therapy.  Follow up with Dr. Corinda Gubler.  6.  Elevated PSA, status post urology workup.   Other problems stable.                    Sonda Primes, MD   AP/MedQ  DD:  01/29/2006  DT:  01/29/2006  Job #:  [id number]   cc:   Ulyess Mort, MD

## 2011-02-05 ENCOUNTER — Other Ambulatory Visit: Payer: Self-pay | Admitting: Internal Medicine

## 2011-03-12 ENCOUNTER — Other Ambulatory Visit: Payer: Self-pay | Admitting: Internal Medicine

## 2011-03-16 ENCOUNTER — Telehealth: Payer: Self-pay | Admitting: *Deleted

## 2011-03-16 DIAGNOSIS — Z0389 Encounter for observation for other suspected diseases and conditions ruled out: Secondary | ICD-10-CM

## 2011-03-16 DIAGNOSIS — Z0000 Encounter for general adult medical examination without abnormal findings: Secondary | ICD-10-CM

## 2011-03-16 NOTE — Telephone Encounter (Signed)
Labs entered.

## 2011-03-16 NOTE — Telephone Encounter (Signed)
Message copied by Merrilyn Puma on Fri Mar 16, 2011  4:46 PM ------      Message from: Earl Lagos      Created: Mon Mar 12, 2011  3:33 PM      Regarding: labs       Please schedule labs for cpx appt 04/30/11.  Thanks

## 2011-03-29 ENCOUNTER — Telehealth: Payer: Self-pay | Admitting: *Deleted

## 2011-03-29 NOTE — Telephone Encounter (Signed)
Spoke w/pt's assistant. She wanted to confirm that patient needed his Twinrix booster when he comes for CPX apt. Pt did get accelerated TWINRIX last year and so he will need the one year booster. Advised her to have pt remind MD and Misty Stanley at [facility]

## 2011-04-24 ENCOUNTER — Other Ambulatory Visit (INDEPENDENT_AMBULATORY_CARE_PROVIDER_SITE_OTHER): Payer: PRIVATE HEALTH INSURANCE

## 2011-04-24 ENCOUNTER — Other Ambulatory Visit: Payer: Self-pay | Admitting: Internal Medicine

## 2011-04-24 DIAGNOSIS — Z0389 Encounter for observation for other suspected diseases and conditions ruled out: Secondary | ICD-10-CM

## 2011-04-24 DIAGNOSIS — Z0000 Encounter for general adult medical examination without abnormal findings: Secondary | ICD-10-CM

## 2011-04-24 LAB — HEPATIC FUNCTION PANEL
ALT: 89 U/L — ABNORMAL HIGH (ref 0–53)
Albumin: 4.2 g/dL (ref 3.5–5.2)
Bilirubin, Direct: 0.3 mg/dL (ref 0.0–0.3)
Total Protein: 6.9 g/dL (ref 6.0–8.3)

## 2011-04-24 LAB — BASIC METABOLIC PANEL
BUN: 24 mg/dL — ABNORMAL HIGH (ref 6–23)
CO2: 28 mEq/L (ref 19–32)
Calcium: 9.4 mg/dL (ref 8.4–10.5)
Creatinine, Ser: 1.2 mg/dL (ref 0.4–1.5)

## 2011-04-24 LAB — URINALYSIS, ROUTINE W REFLEX MICROSCOPIC
Bilirubin Urine: NEGATIVE
Hgb urine dipstick: NEGATIVE
Leukocytes, UA: NEGATIVE
Nitrite: NEGATIVE
Total Protein, Urine: NEGATIVE
Urobilinogen, UA: 0.2 (ref 0.0–1.0)

## 2011-04-24 LAB — CBC WITH DIFFERENTIAL/PLATELET
Basophils Absolute: 0 10*3/uL (ref 0.0–0.1)
HCT: 46.4 % (ref 39.0–52.0)
Hemoglobin: 15.8 g/dL (ref 13.0–17.0)
Lymphs Abs: 1.8 10*3/uL (ref 0.7–4.0)
MCHC: 34 g/dL (ref 30.0–36.0)
MCV: 94.1 fl (ref 78.0–100.0)
Monocytes Absolute: 0.8 10*3/uL (ref 0.1–1.0)
Monocytes Relative: 8.9 % (ref 3.0–12.0)
Neutro Abs: 5.7 10*3/uL (ref 1.4–7.7)
Platelets: 214 10*3/uL (ref 150.0–400.0)
RDW: 13.5 % (ref 11.5–14.6)

## 2011-04-24 LAB — PSA: PSA: 0.97 ng/mL (ref 0.10–4.00)

## 2011-04-24 LAB — LIPID PANEL
Cholesterol: 245 mg/dL — ABNORMAL HIGH (ref 0–200)
Total CHOL/HDL Ratio: 4
Triglycerides: 320 mg/dL — ABNORMAL HIGH (ref 0.0–149.0)

## 2011-04-24 LAB — LDL CHOLESTEROL, DIRECT: Direct LDL: 149.2 mg/dL

## 2011-04-30 ENCOUNTER — Ambulatory Visit: Payer: PRIVATE HEALTH INSURANCE | Admitting: Internal Medicine

## 2011-05-01 ENCOUNTER — Encounter: Payer: Self-pay | Admitting: Internal Medicine

## 2011-05-01 ENCOUNTER — Ambulatory Visit (INDEPENDENT_AMBULATORY_CARE_PROVIDER_SITE_OTHER): Payer: Medicare Other | Admitting: Internal Medicine

## 2011-05-01 VITALS — BP 140/80 | HR 80 | Temp 98.0°F | Resp 16 | Ht 71.0 in | Wt 211.0 lb

## 2011-05-01 DIAGNOSIS — Z23 Encounter for immunization: Secondary | ICD-10-CM

## 2011-05-01 DIAGNOSIS — R945 Abnormal results of liver function studies: Secondary | ICD-10-CM

## 2011-05-01 DIAGNOSIS — K2270 Barrett's esophagus without dysplasia: Secondary | ICD-10-CM

## 2011-05-01 DIAGNOSIS — I10 Essential (primary) hypertension: Secondary | ICD-10-CM

## 2011-05-01 DIAGNOSIS — M109 Gout, unspecified: Secondary | ICD-10-CM

## 2011-05-01 DIAGNOSIS — Z0000 Encounter for general adult medical examination without abnormal findings: Secondary | ICD-10-CM

## 2011-05-01 DIAGNOSIS — Z136 Encounter for screening for cardiovascular disorders: Secondary | ICD-10-CM

## 2011-05-01 NOTE — Progress Notes (Signed)
Subjective:    Patient ID: Todd Wolf, male    DOB: 02/26/1935, 76 y.o.   MRN: [id number]  HPI  The patient is here for a wellness exam. The patient has been doing well overall without major physical or psychological issues going on lately. Wt Readings from Last 3 Encounters:  05/01/11 211 lb (95.709 kg)  08/17/10 210 lb (95.255 kg)  04/24/10 210 lb (95.255 kg)   BP Readings from Last 3 Encounters:  05/01/11 140/80  08/17/10 130/74  04/24/10 140/80       Review of Systems  Constitutional: Negative for appetite change, fatigue and unexpected weight change.  HENT: Negative for nosebleeds, congestion, sore throat, sneezing, trouble swallowing and neck pain.   Eyes: Negative for itching and visual disturbance.  Respiratory: Negative for cough.   Cardiovascular: Negative for chest pain, palpitations and leg swelling.  Gastrointestinal: Negative for nausea, diarrhea, blood in stool and abdominal distention.  Genitourinary: Negative for frequency and hematuria.  Musculoskeletal: Negative for back pain, joint swelling and gait problem.  Skin: Negative for rash.  Neurological: Negative for dizziness, tremors, speech difficulty and weakness.  Psychiatric/Behavioral: Negative for suicidal ideas, sleep disturbance, dysphoric mood and agitation. The patient is not nervous/anxious.        Objective:   Physical Exam  Constitutional: He is oriented to person, place, and time. He appears well-developed and well-nourished. No distress.  HENT:  Head: Normocephalic and atraumatic.  Right Ear: External ear normal.  Left Ear: External ear normal.  Nose: Nose normal.  Mouth/Throat: Oropharynx is clear and moist. No oropharyngeal exudate.  Eyes: Conjunctivae and EOM are normal. Pupils are equal, round, and reactive to light. Right eye exhibits no discharge. Left eye exhibits no discharge. No scleral icterus.  Neck: Normal range of motion. Neck supple. No JVD present. No tracheal deviation  present. No thyromegaly present.  Cardiovascular: Normal rate, regular rhythm, normal heart sounds and intact distal pulses.  Exam reveals no gallop and no friction rub.   No murmur heard. Pulmonary/Chest: Effort normal and breath sounds normal. No stridor. No respiratory distress. He has no wheezes. He has no rales. He exhibits no tenderness.  Abdominal: Soft. Bowel sounds are normal. He exhibits no distension and no mass. There is no tenderness. There is no rebound and no guarding.  Genitourinary: Rectum normal, prostate normal and penis normal. Guaiac negative stool. No penile tenderness.  Musculoskeletal: Normal range of motion. He exhibits no edema and no tenderness.  Lymphadenopathy:    He has no cervical adenopathy.  Neurological: He is alert and oriented to person, place, and time. He has normal reflexes. No cranial nerve deficit. He exhibits normal muscle tone. Coordination normal.  Skin: Skin is warm and dry. No rash noted. He is not diaphoretic. No erythema. No pallor.  Psychiatric: He has a normal mood and affect. His behavior is normal. Judgment and thought content normal.     Lab Results  Component Value Date   WBC 8.6 04/24/2011   HGB 15.8 04/24/2011   HCT 46.4 04/24/2011   PLT 214.0 04/24/2011   GLUCOSE 104* 04/24/2011   CHOL 245* 04/24/2011   TRIG 320.0* 04/24/2011   HDL 66.90 04/24/2011   LDLDIRECT 149.2 04/24/2011   ALT 89* 04/24/2011   AST 56* 04/24/2011   NA 140 04/24/2011   K 4.4 04/24/2011   CL 104 04/24/2011   CREATININE 1.2 04/24/2011   BUN 24* 04/24/2011   CO2 28 04/24/2011   TSH 0.59 04/24/2011   PSA 0.97  04/24/2011        Assessment & Plan:

## 2011-05-20 DIAGNOSIS — Z0000 Encounter for general adult medical examination without abnormal findings: Secondary | ICD-10-CM | POA: Insufficient documentation

## 2011-05-20 NOTE — Assessment & Plan Note (Signed)
Limit wine

## 2011-05-20 NOTE — Assessment & Plan Note (Signed)
The patient is here for annual Medicare wellness examination and management of other chronic and acute problems.   The risk factors are reflected in the social history.  The roster of all physicians providing medical care to patient - is listed in the Snapshot section of the chart.  Activities of daily living:  The patient is 100% inedpendent in all ADLs: dressing, toileting, feeding as well as independent mobility  Home safety : The patient has smoke detectors in the home. They wear seatbelts.No firearms at home ( firearms are present in the home, kept in a safe fashion). There is no violence in the home.   There is no risks for hepatitis, STDs or HIV. There is no   history of blood transfusion. They have no travel history to infectious disease endemic areas of the world.  The patient has (has not) seen their dentist in the last six month. They have (not) seen their eye doctor in the last year. They deny (admit to) any hearing difficulty and have not had audiologic testing in the last year.  They do not  have excessive sun exposure. Discussed the need for sun protection: hats, long sleeves and use of sunscreen if there is significant sun exposure.   Diet: the importance of a healthy diet is discussed. They do have a healthy (unhealthy-high fat/fast food) diet.  The patient has a regular exercise program: every day.  The benefits of regular aerobic exercise were discussed.  Depression screen: there are no signs or vegative symptoms of depression- irritability, change in appetite, anhedonia, sadness/tearfullness.  Cognitive assessment: the patient manages all their financial and personal affairs and is actively engaged. They could relate day,date,year and events; recalled 3/3 objects at 3 minutes; performed clock-face test normally.  The following portions of the patient's history were reviewed and updated as appropriate: allergies, current medications, past family history, past medical history,   past surgical history, past social history  and problem list.  Vision, hearing, body mass index were assessed and reviewed.   During the course of the visit the patient was educated and counseled about appropriate screening and preventive services including : fall prevention , diabetes screening, nutrition counseling, colorectal cancer screening, and recommended immunizations.

## 2011-05-20 NOTE — Assessment & Plan Note (Signed)
Continue with current prescription therapy as reflected on the Med list.  

## 2011-05-20 NOTE — Assessment & Plan Note (Signed)
No relapse 

## 2011-05-20 NOTE — Assessment & Plan Note (Signed)
Continue with current prescription therapy as reflected on the Med list. EGD surveillance

## 2011-06-01 ENCOUNTER — Other Ambulatory Visit: Payer: Self-pay | Admitting: *Deleted

## 2011-06-01 MED ORDER — FEBUXOSTAT 40 MG PO TABS: 80.0000 mg | ORAL_TABLET | Freq: Every day | ORAL | Status: DC

## 2011-06-01 MED ORDER — BENAZEPRIL HCL 40 MG PO TABS: 40.0000 mg | ORAL_TABLET | Freq: Every day | ORAL | Status: DC

## 2011-06-13 ENCOUNTER — Telehealth: Payer: Self-pay | Admitting: *Deleted

## 2011-06-13 NOTE — Telephone Encounter (Signed)
Clarification requested on Uloric Sig instructions: Please look at this chart; Centricity Sig: Take [1] tablet PO Daily [last OV 08/17/10]. Abstracted 11.05.12 as Sig: Take 80 mg [2 tabs] Daily.? [11.06.12 OV stated no Gout flares]  Patient is requesting to know if they are suppose to be taking 40 mg or 80 mg daily?

## 2011-06-14 MED ORDER — FEBUXOSTAT 40 MG PO TABS: 40.0000 mg | ORAL_TABLET | Freq: Every day | ORAL | Status: DC

## 2011-06-14 NOTE — Telephone Encounter (Signed)
Spoke w/patient. Escript [new 40 mg tablet] Rx to pharmacy per patient request.

## 2011-06-14 NOTE — Telephone Encounter (Signed)
Addended by: Janeal Holmes on: 06/14/2011 01:11 PM   Modules accepted: Orders

## 2011-06-14 NOTE — Telephone Encounter (Signed)
Addended by: Regis Bill on: 06/14/2011 02:10 PM   Modules accepted: Orders

## 2011-06-14 NOTE — Telephone Encounter (Signed)
Spoke to pt and verified that he has been taking 40mg  daily. Current Rx was sent in for 80mg  tablets. Is it ok for pt to cut that in half to get 40mg  dose? Pt asks that we call him on his Cell [phone].

## 2011-06-14 NOTE — Telephone Encounter (Signed)
What has he been taking? - 80 mg. His uric acid level was good in October. If he is on 80 mg/d - we can change to 80 mg 1 a day. Let me know  Thx

## 2011-06-14 NOTE — Telephone Encounter (Signed)
Sorry: cont w/40 mg/d - it is working well. Next Rx - use 40 mg tabs 1 a day. Pls mail a new Rx to Mr Todd Wolf

## 2011-06-27 ENCOUNTER — Other Ambulatory Visit: Payer: Self-pay | Admitting: Internal Medicine

## 2011-07-23 DIAGNOSIS — D1801 Hemangioma of skin and subcutaneous tissue: Secondary | ICD-10-CM | POA: Diagnosis not present

## 2011-07-23 DIAGNOSIS — L821 Other seborrheic keratosis: Secondary | ICD-10-CM | POA: Diagnosis not present

## 2011-07-23 DIAGNOSIS — L819 Disorder of pigmentation, unspecified: Secondary | ICD-10-CM | POA: Diagnosis not present

## 2011-07-23 DIAGNOSIS — L578 Other skin changes due to chronic exposure to nonionizing radiation: Secondary | ICD-10-CM | POA: Diagnosis not present

## 2011-07-23 DIAGNOSIS — L570 Actinic keratosis: Secondary | ICD-10-CM | POA: Diagnosis not present

## 2011-07-28 ENCOUNTER — Other Ambulatory Visit: Payer: Self-pay | Admitting: Internal Medicine

## 2011-09-19 ENCOUNTER — Other Ambulatory Visit: Payer: Self-pay | Admitting: Internal Medicine

## 2011-11-11 ENCOUNTER — Other Ambulatory Visit: Payer: Self-pay | Admitting: Internal Medicine

## 2011-11-27 ENCOUNTER — Telehealth: Payer: Self-pay | Admitting: Internal Medicine

## 2011-11-27 MED ORDER — INDOMETHACIN 50 MG PO CAPS: 50.0000 mg | ORAL_CAPSULE | Freq: Three times a day (TID) | ORAL | Status: DC | PRN

## 2011-11-27 NOTE — Telephone Encounter (Signed)
Done. Thx.

## 2011-11-27 NOTE — Telephone Encounter (Signed)
Caller: [name]/Wife; PCP: Plotnikov, Alex; CB#: [phone]; Call regarding Gout; Pt feels he is having a gout flairup of his ankle. He would like Indocin called in if possible. Pls call pt at number provided. [facility] [address] [place].

## 2011-12-04 ENCOUNTER — Ambulatory Visit (INDEPENDENT_AMBULATORY_CARE_PROVIDER_SITE_OTHER): Payer: Medicare Other | Admitting: Internal Medicine

## 2011-12-04 ENCOUNTER — Encounter: Payer: Self-pay | Admitting: Internal Medicine

## 2011-12-04 ENCOUNTER — Telehealth: Payer: Self-pay | Admitting: Internal Medicine

## 2011-12-04 ENCOUNTER — Ambulatory Visit (INDEPENDENT_AMBULATORY_CARE_PROVIDER_SITE_OTHER)
Admission: RE | Admit: 2011-12-04 | Discharge: 2011-12-04 | Disposition: A | Discharge status: Home / Self Care | Payer: PRIVATE HEALTH INSURANCE | Source: Ambulatory Visit | Attending: Internal Medicine | Admitting: Internal Medicine

## 2011-12-04 VITALS — BP 140/82 | HR 80 | Temp 98.3°F | Resp 16 | Wt 203.0 lb

## 2011-12-04 DIAGNOSIS — K2270 Barrett's esophagus without dysplasia: Secondary | ICD-10-CM | POA: Diagnosis not present

## 2011-12-04 DIAGNOSIS — R0609 Other forms of dyspnea: Secondary | ICD-10-CM

## 2011-12-04 DIAGNOSIS — R079 Chest pain, unspecified: Secondary | ICD-10-CM | POA: Insufficient documentation

## 2011-12-04 DIAGNOSIS — R0989 Other specified symptoms and signs involving the circulatory and respiratory systems: Secondary | ICD-10-CM | POA: Diagnosis not present

## 2011-12-04 DIAGNOSIS — I10 Essential (primary) hypertension: Secondary | ICD-10-CM | POA: Diagnosis not present

## 2011-12-04 DIAGNOSIS — R7989 Other specified abnormal findings of blood chemistry: Secondary | ICD-10-CM

## 2011-12-04 DIAGNOSIS — M109 Gout, unspecified: Secondary | ICD-10-CM

## 2011-12-04 DIAGNOSIS — R945 Abnormal results of liver function studies: Secondary | ICD-10-CM

## 2011-12-04 DIAGNOSIS — R0600 Dyspnea, unspecified: Secondary | ICD-10-CM

## 2011-12-04 NOTE — Telephone Encounter (Signed)
Stacey, please, inform patient that his CXR is ok Thx  

## 2011-12-04 NOTE — Progress Notes (Signed)
Patient ID: Todd Wolf, male   DOB: [birth year], 77 y.o.   MRN: [id number]  Subjective:    Patient ID: Todd Wolf, male    DOB: June 10, 1935, 77 y.o.   MRN: [id number]  HPI  C/o more SOB and CP with walking and hard work. He is planning a strenuous 3 day mountain climbing/hunting trip at 3000 ft  in [place] in 10.13. F/u HTN, gout, elev LFTs  Wt Readings from Last 3 Encounters:  12/04/11 203 lb (92.08 kg)  05/01/11 211 lb (95.709 kg)  08/17/10 210 lb (95.255 kg)   BP Readings from Last 3 Encounters:  12/04/11 140/82  05/01/11 140/80  08/17/10 130/74       Review of Systems  Constitutional: Negative for appetite change, fatigue and unexpected weight change.  HENT: Negative for nosebleeds, congestion, sore throat, sneezing, trouble swallowing and neck pain.   Eyes: Negative for itching and visual disturbance.  Respiratory: Negative for cough.   Cardiovascular: Negative for chest pain, palpitations and leg swelling.  Gastrointestinal: Negative for nausea, diarrhea, blood in stool and abdominal distention.  Genitourinary: Negative for frequency and hematuria.  Musculoskeletal: Negative for back pain, joint swelling and gait problem.  Skin: Negative for rash.  Neurological: Negative for dizziness, tremors, speech difficulty and weakness.  Psychiatric/Behavioral: Negative for suicidal ideas, sleep disturbance, dysphoric mood and agitation. The patient is not nervous/anxious.        Objective:   Physical Exam  Constitutional: He is oriented to person, place, and time. He appears well-developed and well-nourished. No distress.  HENT:  Head: Normocephalic and atraumatic.  Right Ear: External ear normal.  Left Ear: External ear normal.  Nose: Nose normal.  Mouth/Throat: Oropharynx is clear and moist. No oropharyngeal exudate.  Eyes: Conjunctivae and EOM are normal. Pupils are equal, round, and reactive to light. Right eye exhibits no discharge. Left eye exhibits no discharge.  No scleral icterus.  Neck: Normal range of motion. Neck supple. No JVD present. No tracheal deviation present. No thyromegaly present.  Cardiovascular: Normal rate, regular rhythm, normal heart sounds and intact distal pulses.  Exam reveals no gallop and no friction rub.   No murmur heard. Pulmonary/Chest: Effort normal and breath sounds normal. No stridor. No respiratory distress. He has no wheezes. He has no rales. He exhibits no tenderness.  Abdominal: Soft. Bowel sounds are normal. He exhibits no distension and no mass. There is no tenderness. There is no rebound and no guarding.  Genitourinary: Rectum normal, prostate normal and penis normal. Guaiac negative stool. No penile tenderness.  Musculoskeletal: Normal range of motion. He exhibits no edema and no tenderness.  Lymphadenopathy:    He has no cervical adenopathy.  Neurological: He is alert and oriented to person, place, and time. He has normal reflexes. No cranial nerve deficit. He exhibits normal muscle tone. Coordination normal.  Skin: Skin is warm and dry. No rash noted. He is not diaphoretic. No erythema. No pallor.  Psychiatric: He has a normal mood and affect. His behavior is normal. Judgment and thought content normal.     Lab Results  Component Value Date   WBC 8.6 04/24/2011   HGB 15.8 04/24/2011   HCT 46.4 04/24/2011   PLT 214.0 04/24/2011   GLUCOSE 104* 04/24/2011   CHOL 245* 04/24/2011   TRIG 320.0* 04/24/2011   HDL 66.90 04/24/2011   LDLDIRECT 149.2 04/24/2011   ALT 89* 04/24/2011   AST 56* 04/24/2011   NA 140 04/24/2011   K 4.4 04/24/2011  CL 104 04/24/2011   CREATININE 1.2 04/24/2011   BUN 24* 04/24/2011   CO2 28 04/24/2011   TSH 0.59 04/24/2011   PSA 0.97 04/24/2011   EKG was nl     Assessment & Plan:

## 2011-12-05 ENCOUNTER — Encounter: Payer: Self-pay | Admitting: Internal Medicine

## 2011-12-05 NOTE — Assessment & Plan Note (Signed)
Continue with current prescription therapy as reflected on the Med list.  

## 2011-12-05 NOTE — Telephone Encounter (Signed)
Left detailed mess informing pt of below.  

## 2011-12-05 NOTE — Assessment & Plan Note (Signed)
R/o CAD. Stress ECHO CXR

## 2011-12-05 NOTE — Assessment & Plan Note (Signed)
CXR Stress ECHO

## 2011-12-05 NOTE — Assessment & Plan Note (Signed)
Repeat LFTs Loose 5 lbs

## 2011-12-19 ENCOUNTER — Encounter: Payer: Self-pay | Admitting: Internal Medicine

## 2011-12-19 ENCOUNTER — Ambulatory Visit (HOSPITAL_COMMUNITY): Payer: Medicare Other | Attending: Internal Medicine

## 2011-12-19 DIAGNOSIS — R072 Precordial pain: Secondary | ICD-10-CM | POA: Diagnosis not present

## 2011-12-19 DIAGNOSIS — I10 Essential (primary) hypertension: Secondary | ICD-10-CM | POA: Diagnosis not present

## 2011-12-19 DIAGNOSIS — R0989 Other specified symptoms and signs involving the circulatory and respiratory systems: Secondary | ICD-10-CM | POA: Insufficient documentation

## 2011-12-19 DIAGNOSIS — R0609 Other forms of dyspnea: Secondary | ICD-10-CM | POA: Insufficient documentation

## 2011-12-19 DIAGNOSIS — K2270 Barrett's esophagus without dysplasia: Secondary | ICD-10-CM | POA: Diagnosis not present

## 2011-12-19 DIAGNOSIS — R0602 Shortness of breath: Secondary | ICD-10-CM | POA: Diagnosis not present

## 2011-12-19 DIAGNOSIS — R0600 Dyspnea, unspecified: Secondary | ICD-10-CM

## 2011-12-19 DIAGNOSIS — R079 Chest pain, unspecified: Secondary | ICD-10-CM | POA: Diagnosis not present

## 2011-12-19 NOTE — Progress Notes (Signed)
Echocardiogram performed.  

## 2011-12-20 ENCOUNTER — Telehealth: Payer: Self-pay | Admitting: Internal Medicine

## 2011-12-20 NOTE — Telephone Encounter (Signed)
Pt informed

## 2011-12-20 NOTE — Telephone Encounter (Signed)
Todd Wolf, please, inform patient that his stress test was normal - good news! Thx

## 2012-01-07 ENCOUNTER — Other Ambulatory Visit: Payer: Self-pay | Admitting: Internal Medicine

## 2012-01-19 ENCOUNTER — Other Ambulatory Visit: Payer: Self-pay | Admitting: Internal Medicine

## 2012-01-21 DIAGNOSIS — L821 Other seborrheic keratosis: Secondary | ICD-10-CM | POA: Diagnosis not present

## 2012-01-21 DIAGNOSIS — L578 Other skin changes due to chronic exposure to nonionizing radiation: Secondary | ICD-10-CM | POA: Diagnosis not present

## 2012-01-21 DIAGNOSIS — L819 Disorder of pigmentation, unspecified: Secondary | ICD-10-CM | POA: Diagnosis not present

## 2012-02-21 ENCOUNTER — Ambulatory Visit (INDEPENDENT_AMBULATORY_CARE_PROVIDER_SITE_OTHER): Payer: Medicare Other | Admitting: Internal Medicine

## 2012-02-21 ENCOUNTER — Encounter: Payer: Self-pay | Admitting: Internal Medicine

## 2012-02-21 VITALS — BP 130/88 | HR 80 | Temp 97.4°F | Resp 16 | Wt 211.0 lb

## 2012-02-21 DIAGNOSIS — E785 Hyperlipidemia, unspecified: Secondary | ICD-10-CM | POA: Diagnosis not present

## 2012-02-21 DIAGNOSIS — R197 Diarrhea, unspecified: Secondary | ICD-10-CM | POA: Diagnosis not present

## 2012-02-21 DIAGNOSIS — J069 Acute upper respiratory infection, unspecified: Secondary | ICD-10-CM

## 2012-02-21 DIAGNOSIS — I10 Essential (primary) hypertension: Secondary | ICD-10-CM

## 2012-02-21 DIAGNOSIS — K2270 Barrett's esophagus without dysplasia: Secondary | ICD-10-CM

## 2012-02-21 MED ORDER — PROMETHAZINE-CODEINE 6.25-10 MG/5ML PO SYRP: 5.0000 mL | ORAL_SOLUTION | ORAL | Status: AC | PRN

## 2012-02-21 MED ORDER — LEVOFLOXACIN 500 MG PO TABS: 500.0000 mg | ORAL_TABLET | Freq: Every day | ORAL | Status: AC

## 2012-02-21 MED ORDER — COLESEVELAM HCL 625 MG PO TABS: 625.0000 mg | ORAL_TABLET | Freq: Two times a day (BID) | ORAL | Status: DC

## 2012-02-21 MED ORDER — ESOMEPRAZOLE MAGNESIUM 40 MG PO CPDR: 40.0000 mg | DELAYED_RELEASE_CAPSULE | Freq: Every day | ORAL | Status: DC

## 2012-02-21 NOTE — Assessment & Plan Note (Signed)
See med change 

## 2012-02-21 NOTE — Assessment & Plan Note (Signed)
Welchol rx

## 2012-02-21 NOTE — Patient Instructions (Addendum)
Use over-the-counter  "cold" medicines  as "Afrin" nasal spray for nasal congestion as directed instead. Use" Delsym" or" Robitussin" cough syrup varietis for cough.  You can use plain "Tylenol" or "Advil" for fever, chills and achyness.

## 2012-02-21 NOTE — Assessment & Plan Note (Signed)
Levaquin x 10 d. Align 1 a day x 2 wks Prom-cod syr prn

## 2012-02-21 NOTE — Assessment & Plan Note (Signed)
Continue with current prescription therapy as reflected on the Med list.  

## 2012-02-21 NOTE — Progress Notes (Signed)
  Subjective:    Patient ID: Todd Wolf, male    DOB: 1935/01/22, 77 y.o.   MRN: [id number]  HPI  C/o URI sx's x 3 wks Took a Zpac - not better F/u diarrhea and GERD - needs meds adjusted  Review of Systems  Constitutional: Positive for fatigue. Negative for fever and chills.  HENT: Positive for congestion, rhinorrhea and sinus pressure. Negative for nosebleeds.   Respiratory: Positive for cough and wheezing. Negative for chest tightness and shortness of breath.   Gastrointestinal: Negative for abdominal pain.  Musculoskeletal: Negative for joint swelling.       Objective:   Physical Exam  Constitutional: He is oriented to person, place, and time. He appears well-developed.  HENT:  Mouth/Throat: Oropharynx is clear and moist.  Eyes: Conjunctivae are normal. Pupils are equal, round, and reactive to light.  Neck: Normal range of motion. No JVD present. No thyromegaly present.  Cardiovascular: Normal rate, regular rhythm, normal heart sounds and intact distal pulses.  Exam reveals no gallop and no friction rub.   No murmur heard. Pulmonary/Chest: Effort normal and breath sounds normal. No respiratory distress. He has no wheezes. He has no rales. He exhibits no tenderness.  Abdominal: Soft. Bowel sounds are normal. He exhibits no distension and no mass. There is no tenderness. There is no rebound and no guarding.  Musculoskeletal: Normal range of motion. He exhibits no edema and no tenderness.  Lymphadenopathy:    He has no cervical adenopathy.  Neurological: He is alert and oriented to person, place, and time. He has normal reflexes. No cranial nerve deficit. He exhibits normal muscle tone. Coordination normal.  Skin: Skin is warm and dry. No rash noted.  Psychiatric: He has a normal mood and affect. His behavior is normal. Judgment and thought content normal.          Assessment & Plan:

## 2012-02-21 NOTE — Assessment & Plan Note (Signed)
Welchol bid

## 2012-03-11 ENCOUNTER — Telehealth: Payer: Self-pay | Admitting: Internal Medicine

## 2012-03-11 NOTE — Telephone Encounter (Signed)
OK OV w/me - work in [employer] in [employer] - use prn Thx

## 2012-03-11 NOTE — Telephone Encounter (Signed)
Caller: [facility]/Patient; Patient Name: Todd Wolf; PCP: Plotnikov, Alex (Adults only); Best Callback Phone Number: [phone]; Reason for call: Dizziness.   [name] states last night in the middle of night , awoke to go to the bathroom and almost  "fell down" due to dizziness. No injury,  did not hit the floor went back to bed and since awakening has been dizzy.  He is very active but has been sitting in the lounge chair. Has had recent cold/congestion back from travel in [place] and plane ride. Slight nausea, loose stool X1. Dizzy with standing. He is eating and drinking normally. AA0X3, does state he feels funny with head movement. Emergent s/sx ruled out per Dizziness or Vertigo Protocol with exception to Symptoms worsen with movement of head or looking up and no previously evaluated. See Provider in 24 hours.  Offered appointment. Caller is out of town.  Would like to schedule tomorrow with physician.  No appt available until 4:15 advised I would have office review schedule and return call. Encouraged patient to closely mointor s/sx, safety and reviewed home care. Understanding expressed.  PLEASE CONTACT PATIENT FOR APPOINTMENT 03/12/12 WITH DR.PLOTNIKOV.

## 2012-03-12 NOTE — Telephone Encounter (Signed)
Called pt to schedule apt and he declined apt at this time because he is feeling better, he will call back if he feels like he needs to be seen

## 2012-03-13 MED ORDER — MECLIZINE HCL 25 MG PO TABS: 25.0000 mg | ORAL_TABLET | Freq: Three times a day (TID) | ORAL | Status: DC | PRN

## 2012-03-24 ENCOUNTER — Telehealth: Payer: Self-pay | Admitting: *Deleted

## 2012-03-24 NOTE — Telephone Encounter (Signed)
Pls work in this week Thx

## 2012-03-24 NOTE — Telephone Encounter (Signed)
Caller states she tried to schedule a CPE for pt and your earliest is 06/23/12. She states he still continues to have the same problems he was sen for recently and wants to know if you can see him sooner. Please advise.

## 2012-03-25 ENCOUNTER — Telehealth: Payer: Self-pay | Admitting: Internal Medicine

## 2012-03-25 NOTE — Telephone Encounter (Signed)
Midge Aver to contact [facility] to schedule OV.

## 2012-03-25 NOTE — Telephone Encounter (Signed)
See previous phone note.  Per Misty Stanley I called the patient's assistant.  She said he is still having dizziness.  I offered an appt. On tomorrow.  She refused for him to be seen just for the dizziness, she wanted him to come for a physical.  He is sceduled in Dec for a Physical.  Explained to her that those appointments are booked out to Dec, but that he can be seen for the dizziness.  He is going out of town.  She said she will call back if he is still having it when he comes home.

## 2012-04-28 ENCOUNTER — Other Ambulatory Visit: Payer: Medicare Other

## 2012-04-28 ENCOUNTER — Other Ambulatory Visit: Payer: Self-pay | Admitting: *Deleted

## 2012-04-28 DIAGNOSIS — Z0389 Encounter for observation for other suspected diseases and conditions ruled out: Secondary | ICD-10-CM

## 2012-04-28 DIAGNOSIS — Z0000 Encounter for general adult medical examination without abnormal findings: Secondary | ICD-10-CM

## 2012-04-30 ENCOUNTER — Other Ambulatory Visit: Payer: Self-pay | Admitting: Internal Medicine

## 2012-05-08 ENCOUNTER — Encounter: Payer: Self-pay | Admitting: Internal Medicine

## 2012-05-08 ENCOUNTER — Other Ambulatory Visit (INDEPENDENT_AMBULATORY_CARE_PROVIDER_SITE_OTHER): Payer: Medicare Other

## 2012-05-08 ENCOUNTER — Ambulatory Visit (INDEPENDENT_AMBULATORY_CARE_PROVIDER_SITE_OTHER): Payer: Medicare Other | Admitting: Internal Medicine

## 2012-05-08 VITALS — BP 150/82 | HR 80 | Temp 97.7°F | Resp 16 | Ht 71.0 in | Wt 213.0 lb

## 2012-05-08 DIAGNOSIS — E785 Hyperlipidemia, unspecified: Secondary | ICD-10-CM

## 2012-05-08 DIAGNOSIS — K2270 Barrett's esophagus without dysplasia: Secondary | ICD-10-CM

## 2012-05-08 DIAGNOSIS — R945 Abnormal results of liver function studies: Secondary | ICD-10-CM

## 2012-05-08 DIAGNOSIS — Z0000 Encounter for general adult medical examination without abnormal findings: Secondary | ICD-10-CM | POA: Diagnosis not present

## 2012-05-08 DIAGNOSIS — N400 Enlarged prostate without lower urinary tract symptoms: Secondary | ICD-10-CM

## 2012-05-08 DIAGNOSIS — R7309 Other abnormal glucose: Secondary | ICD-10-CM

## 2012-05-08 DIAGNOSIS — Z23 Encounter for immunization: Secondary | ICD-10-CM

## 2012-05-08 DIAGNOSIS — M109 Gout, unspecified: Secondary | ICD-10-CM

## 2012-05-08 DIAGNOSIS — K219 Gastro-esophageal reflux disease without esophagitis: Secondary | ICD-10-CM

## 2012-05-08 DIAGNOSIS — I10 Essential (primary) hypertension: Secondary | ICD-10-CM | POA: Diagnosis not present

## 2012-05-08 LAB — URINALYSIS
Leukocytes, UA: NEGATIVE
Specific Gravity, Urine: 1.02 (ref 1.000–1.030)
Urine Glucose: NEGATIVE
Urobilinogen, UA: 1 (ref 0.0–1.0)
pH: 7 (ref 5.0–8.0)

## 2012-05-08 LAB — CBC WITH DIFFERENTIAL/PLATELET
Basophils Relative: 0.2 % (ref 0.0–3.0)
Eosinophils Relative: 3.3 % (ref 0.0–5.0)
HCT: 46.9 % (ref 39.0–52.0)
Hemoglobin: 15.9 g/dL (ref 13.0–17.0)
Lymphs Abs: 1.7 10*3/uL (ref 0.7–4.0)
MCHC: 34 g/dL (ref 30.0–36.0)
MCV: 93 fl (ref 78.0–100.0)
Monocytes Absolute: 0.7 10*3/uL (ref 0.1–1.0)
Neutro Abs: 4.9 10*3/uL (ref 1.4–7.7)
Neutrophils Relative %: 64.8 % (ref 43.0–77.0)
RBC: 5.05 Mil/uL (ref 4.22–5.81)
WBC: 7.6 10*3/uL (ref 4.5–10.5)

## 2012-05-08 LAB — HEPATIC FUNCTION PANEL
AST: 44 U/L — ABNORMAL HIGH (ref 0–37)
Bilirubin, Direct: 0.3 mg/dL (ref 0.0–0.3)
Total Bilirubin: 1.4 mg/dL — ABNORMAL HIGH (ref 0.3–1.2)

## 2012-05-08 LAB — LIPID PANEL
Total CHOL/HDL Ratio: 4
Triglycerides: 321 mg/dL — ABNORMAL HIGH (ref 0.0–149.0)

## 2012-05-08 LAB — BASIC METABOLIC PANEL
CO2: 28 mEq/L (ref 19–32)
Chloride: 103 mEq/L (ref 96–112)
Potassium: 4.5 mEq/L (ref 3.5–5.1)
Sodium: 140 mEq/L (ref 135–145)

## 2012-05-08 LAB — LDL CHOLESTEROL, DIRECT: Direct LDL: 144.9 mg/dL

## 2012-05-08 LAB — URIC ACID: Uric Acid, Serum: 7.1 mg/dL (ref 4.0–7.8)

## 2012-05-08 LAB — TSH: TSH: 0.62 u[IU]/mL (ref 0.35–5.50)

## 2012-05-08 MED ORDER — COLESEVELAM HCL 625 MG PO TABS: 625.0000 mg | ORAL_TABLET | Freq: Two times a day (BID) | ORAL | Status: DC

## 2012-05-08 MED ORDER — ESOMEPRAZOLE MAGNESIUM 40 MG PO CPDR: 40.0000 mg | DELAYED_RELEASE_CAPSULE | Freq: Two times a day (BID) | ORAL | Status: DC

## 2012-05-08 MED ORDER — FEBUXOSTAT 40 MG PO TABS: 40.0000 mg | ORAL_TABLET | Freq: Every day | ORAL | Status: DC

## 2012-05-08 NOTE — Assessment & Plan Note (Signed)
Continue with current prescription therapy as reflected on the Med list.  

## 2012-05-08 NOTE — Assessment & Plan Note (Addendum)
Suboptimal control. Continue with current prescription therapy as reflected on the Med list. Loose 10 lbs

## 2012-05-08 NOTE — Assessment & Plan Note (Addendum)
Wt loss. Re-check in 3 mo. A1c

## 2012-05-08 NOTE — Assessment & Plan Note (Addendum)
Uric acid is up. Continue with current prescription therapy as reflected on the Med list. Diet, wt loss

## 2012-05-08 NOTE — Progress Notes (Signed)
  Subjective:    Patient ID: Todd Wolf, male    DOB: Jul 06, 1934, 77 y.o.   MRN: [id number]  HPI  The patient is here for a wellness exam. The patient has been doing well overall without major physical or psychological issues going on lately.  SBP at home is 130-140  BP Readings from Last 3 Encounters:  05/08/12 150/82  02/21/12 130/88  12/04/11 140/82   Wt Readings from Last 3 Encounters:  05/08/12 213 lb (96.616 kg)  02/21/12 211 lb (95.709 kg)  12/04/11 203 lb (92.08 kg)      Review of Systems  Constitutional: Positive for fatigue. Negative for fever and chills.  HENT: Positive for congestion, rhinorrhea and sinus pressure. Negative for nosebleeds.   Respiratory: Positive for cough and wheezing. Negative for chest tightness and shortness of breath.   Gastrointestinal: Negative for abdominal pain.  Musculoskeletal: Negative for joint swelling.       Objective:   Physical Exam  Constitutional: He is oriented to person, place, and time. He appears well-developed.  HENT:  Mouth/Throat: Oropharynx is clear and moist.  Eyes: Conjunctivae normal are normal. Pupils are equal, round, and reactive to light.  Neck: Normal range of motion. No JVD present. No thyromegaly present.  Cardiovascular: Normal rate, regular rhythm, normal heart sounds and intact distal pulses.  Exam reveals no gallop and no friction rub.   No murmur heard. Pulmonary/Chest: Effort normal and breath sounds normal. No respiratory distress. He has no wheezes. He has no rales. He exhibits no tenderness.  Abdominal: Soft. Bowel sounds are normal. He exhibits no distension and no mass. There is no tenderness. There is no rebound and no guarding.  Musculoskeletal: Normal range of motion. He exhibits no edema and no tenderness.  Lymphadenopathy:    He has no cervical adenopathy.  Neurological: He is alert and oriented to person, place, and time. He has normal reflexes. No cranial nerve deficit. He exhibits  normal muscle tone. Coordination normal.  Skin: Skin is warm and dry. No rash noted.  Psychiatric: He has a normal mood and affect. His behavior is normal. Judgment and thought content normal.    Lab Results  Component Value Date   WBC 8.6 04/24/2011   HGB 15.8 04/24/2011   HCT 46.4 04/24/2011   PLT 214.0 04/24/2011   GLUCOSE 104* 04/24/2011   CHOL 245* 04/24/2011   TRIG 320.0* 04/24/2011   HDL 66.90 04/24/2011   LDLDIRECT 149.2 04/24/2011   ALT 89* 04/24/2011   AST 56* 04/24/2011   NA 140 04/24/2011   K 4.4 04/24/2011   CL 104 04/24/2011   CREATININE 1.2 04/24/2011   BUN 24* 04/24/2011   CO2 28 04/24/2011   TSH 0.59 04/24/2011   PSA 0.97 04/24/2011         Assessment & Plan:

## 2012-05-08 NOTE — Patient Instructions (Signed)
Wt Readings from Last 3 Encounters:  05/08/12 213 lb (96.616 kg)  02/21/12 211 lb (95.709 kg)  12/04/11 203 lb (92.08 kg)   BP Readings from Last 3 Encounters:  05/08/12 150/82  02/21/12 130/88  12/04/11 140/82

## 2012-05-12 NOTE — Assessment & Plan Note (Signed)
Worse. Wt loss, limit wine. Re-check in 3 mo

## 2012-05-12 NOTE — Assessment & Plan Note (Signed)
Worse. Will have to loose wt. Limit wine to 1-2 glasses at night

## 2012-05-12 NOTE — Assessment & Plan Note (Signed)
The patient is here for annual Medicare wellness examination and management of other chronic and acute problems.   The risk factors are reflected in the social history.  The roster of all physicians providing medical care to patient - is listed in the Snapshot section of the chart.  Activities of daily living:  The patient is 100% inedpendent in all ADLs: dressing, toileting, feeding as well as independent mobility  Home safety : The patient has smoke detectors in the home. They wear seatbelts.No firearms at home ( firearms are present in the home, kept in a safe fashion). There is no violence in the home.   There is no risks for hepatitis, STDs or HIV. There is no   history of blood transfusion. They have no travel history to infectious disease endemic areas of the world.  The patient has (has not) seen their dentist in the last six month. They have (not) seen their eye doctor in the last year. They deny (admit to) any hearing difficulty and have not had audiologic testing in the last year.  They do not  have excessive sun exposure. Discussed the need for sun protection: hats, long sleeves and use of sunscreen if there is significant sun exposure.   Diet: the importance of a healthy diet is discussed. They do have a healthy (unhealthy-high fat/fast food) diet.  The patient has a regular exercise program: every day.  The benefits of regular aerobic exercise were discussed.  Depression screen: there are no signs or vegative symptoms of depression- irritability, change in appetite, anhedonia, sadness/tearfullness.  Cognitive assessment: the patient manages all their financial and personal affairs and is actively engaged. They could relate day,date,year and events; recalled 3/3 objects at 3 minutes; performed clock-face test normally.  The following portions of the patient's history were reviewed and updated as appropriate: allergies, current medications, past family history, past medical history,   past surgical history, past social history  and problem list.  Vision, hearing, body mass index were assessed and reviewed.   During the course of the visit the patient was educated and counseled about appropriate screening and preventive services including : fall prevention , diabetes screening, nutrition counseling, colorectal cancer screening, and recommended immunizations.  

## 2012-05-13 DIAGNOSIS — H269 Unspecified cataract: Secondary | ICD-10-CM | POA: Diagnosis not present

## 2012-05-14 ENCOUNTER — Encounter: Payer: Self-pay | Admitting: Gastroenterology

## 2012-05-19 ENCOUNTER — Telehealth: Payer: Self-pay | Admitting: Gastroenterology

## 2012-05-19 NOTE — Telephone Encounter (Signed)
Reminder note entered.

## 2012-05-19 NOTE — Telephone Encounter (Signed)
Dr Jarold Motto, I do not know if this is a personal friend or not because he lives in [place], but he wants to talk to you about an EGD. Recall letter states he needs an OV to decide whether he needs a repeat EGD; hx of Barrett's. His number is [phone]. Please advise. Thanks.

## 2012-05-19 NOTE — Telephone Encounter (Signed)
Please schedule for endoscopy and colonoscopy.  You will to do this in March.

## 2012-06-23 ENCOUNTER — Encounter: Payer: Medicare Other | Admitting: Internal Medicine

## 2012-06-24 ENCOUNTER — Telehealth: Payer: Self-pay | Admitting: *Deleted

## 2012-06-24 MED ORDER — DICLOFENAC SODIUM 1.5 % TD SOLN: TRANSDERMAL | Status: DC

## 2012-06-24 NOTE — Telephone Encounter (Signed)
REFILL ON MEDICATION DICLOFENAC SENT TO [facility] IN [facility].

## 2012-06-29 ENCOUNTER — Other Ambulatory Visit: Payer: Self-pay | Admitting: Internal Medicine

## 2012-06-30 ENCOUNTER — Other Ambulatory Visit: Payer: Self-pay | Admitting: Internal Medicine

## 2012-07-07 ENCOUNTER — Other Ambulatory Visit: Payer: Self-pay | Admitting: Internal Medicine

## 2012-07-09 ENCOUNTER — Telehealth: Payer: Self-pay | Admitting: *Deleted

## 2012-07-09 NOTE — Telephone Encounter (Signed)
Called ins re: PA for Pennsaid. I answered all questions and representative I spoke to said it will be submitted for clinical review. We will receive a fax back with in 72 hours.

## 2012-07-18 NOTE — Telephone Encounter (Signed)
PA for Pennsaid 1.5 % solution is approved for as long as the member is on the current policy. Pharmacy informed.

## 2012-08-04 DIAGNOSIS — L2089 Other atopic dermatitis: Secondary | ICD-10-CM | POA: Diagnosis not present

## 2012-08-04 DIAGNOSIS — L821 Other seborrheic keratosis: Secondary | ICD-10-CM | POA: Diagnosis not present

## 2012-08-04 DIAGNOSIS — L819 Disorder of pigmentation, unspecified: Secondary | ICD-10-CM | POA: Diagnosis not present

## 2012-08-04 DIAGNOSIS — L570 Actinic keratosis: Secondary | ICD-10-CM | POA: Diagnosis not present

## 2012-08-04 DIAGNOSIS — L820 Inflamed seborrheic keratosis: Secondary | ICD-10-CM | POA: Diagnosis not present

## 2012-08-04 DIAGNOSIS — L299 Pruritus, unspecified: Secondary | ICD-10-CM | POA: Diagnosis not present

## 2012-08-04 DIAGNOSIS — L578 Other skin changes due to chronic exposure to nonionizing radiation: Secondary | ICD-10-CM | POA: Diagnosis not present

## 2012-08-05 ENCOUNTER — Other Ambulatory Visit: Payer: Self-pay | Admitting: Internal Medicine

## 2012-08-14 ENCOUNTER — Encounter: Payer: Self-pay | Admitting: Gastroenterology

## 2012-08-21 ENCOUNTER — Encounter: Payer: Self-pay | Admitting: *Deleted

## 2012-08-25 ENCOUNTER — Encounter: Payer: Self-pay | Admitting: Internal Medicine

## 2012-08-25 ENCOUNTER — Ambulatory Visit (INDEPENDENT_AMBULATORY_CARE_PROVIDER_SITE_OTHER): Payer: Medicare Other | Admitting: Internal Medicine

## 2012-08-25 VITALS — BP 140/85 | HR 80 | Temp 98.0°F | Resp 16 | Wt 218.0 lb

## 2012-08-25 DIAGNOSIS — R079 Chest pain, unspecified: Secondary | ICD-10-CM

## 2012-08-25 DIAGNOSIS — I10 Essential (primary) hypertension: Secondary | ICD-10-CM | POA: Diagnosis not present

## 2012-08-25 DIAGNOSIS — K2270 Barrett's esophagus without dysplasia: Secondary | ICD-10-CM | POA: Diagnosis not present

## 2012-08-25 DIAGNOSIS — R197 Diarrhea, unspecified: Secondary | ICD-10-CM

## 2012-08-25 DIAGNOSIS — R7309 Other abnormal glucose: Secondary | ICD-10-CM

## 2012-08-25 DIAGNOSIS — R945 Abnormal results of liver function studies: Secondary | ICD-10-CM

## 2012-08-25 NOTE — Assessment & Plan Note (Signed)
BP Readings from Last 3 Encounters:  08/25/12 140/85  05/08/12 150/82  02/21/12 130/88

## 2012-08-25 NOTE — Progress Notes (Signed)
   Subjective:    HPI    The patient has been doing well overall without major physical or psychological issues going on lately. He has gained a little wt  SBP at home is 130-140  BP Readings from Last 3 Encounters:  08/25/12 160/90  05/08/12 150/82  02/21/12 130/88   Wt Readings from Last 3 Encounters:  08/25/12 218 lb (98.884 kg)  05/08/12 213 lb (96.616 kg)  02/21/12 211 lb (95.709 kg)      Review of Systems  Constitutional: Positive for fatigue. Negative for fever and chills.  HENT: Positive for congestion, rhinorrhea and sinus pressure. Negative for nosebleeds.   Respiratory: Positive for cough and wheezing. Negative for chest tightness and shortness of breath.   Gastrointestinal: Negative for abdominal pain.  Musculoskeletal: Negative for joint swelling.       Objective:   Physical Exam  Constitutional: He is oriented to person, place, and time. He appears well-developed.  HENT:  Mouth/Throat: Oropharynx is clear and moist.  Eyes: Conjunctivae are normal. Pupils are equal, round, and reactive to light.  Neck: Normal range of motion. No JVD present. No thyromegaly present.  Cardiovascular: Normal rate, regular rhythm, normal heart sounds and intact distal pulses.  Exam reveals no gallop and no friction rub.   No murmur heard. Pulmonary/Chest: Effort normal and breath sounds normal. No respiratory distress. He has no wheezes. He has no rales. He exhibits no tenderness.  Abdominal: Soft. Bowel sounds are normal. He exhibits no distension and no mass. There is no tenderness. There is no rebound and no guarding.  Musculoskeletal: Normal range of motion. He exhibits no edema and no tenderness.  Lymphadenopathy:    He has no cervical adenopathy.  Neurological: He is alert and oriented to person, place, and time. He has normal reflexes. No cranial nerve deficit. He exhibits normal muscle tone. Coordination normal.  Skin: Skin is warm and dry. No rash noted.   Psychiatric: He has a normal mood and affect. His behavior is normal. Judgment and thought content normal.    Lab Results  Component Value Date   WBC 7.6 05/08/2012   HGB 15.9 05/08/2012   HCT 46.9 05/08/2012   PLT 204.0 05/08/2012   GLUCOSE 117* 05/08/2012   CHOL 270* 05/08/2012   TRIG 321.0* 05/08/2012   HDL 64.50 05/08/2012   LDLDIRECT 144.9 05/08/2012   ALT 84* 05/08/2012   AST 44* 05/08/2012   NA 140 05/08/2012   K 4.5 05/08/2012   CL 103 05/08/2012   CREATININE 1.1 05/08/2012   BUN 21 05/08/2012   CO2 28 05/08/2012   TSH 0.62 05/08/2012   PSA 0.97 05/08/2012         Assessment & Plan:

## 2012-08-25 NOTE — Assessment & Plan Note (Signed)
Check A1c/BMET

## 2012-08-25 NOTE — Assessment & Plan Note (Signed)
EGD is pending soon

## 2012-08-25 NOTE — Assessment & Plan Note (Signed)
Controlled.  

## 2012-08-25 NOTE — Assessment & Plan Note (Signed)
Monitoring

## 2012-08-25 NOTE — Assessment & Plan Note (Signed)
No relapse 

## 2012-09-01 ENCOUNTER — Other Ambulatory Visit (INDEPENDENT_AMBULATORY_CARE_PROVIDER_SITE_OTHER): Payer: Medicare Other

## 2012-09-01 ENCOUNTER — Encounter: Payer: Self-pay | Admitting: Gastroenterology

## 2012-09-01 ENCOUNTER — Ambulatory Visit (INDEPENDENT_AMBULATORY_CARE_PROVIDER_SITE_OTHER): Payer: Medicare Other | Admitting: Gastroenterology

## 2012-09-01 VITALS — BP 146/84 | HR 66 | Ht 69.5 in | Wt 216.5 lb

## 2012-09-01 DIAGNOSIS — I10 Essential (primary) hypertension: Secondary | ICD-10-CM

## 2012-09-01 DIAGNOSIS — R197 Diarrhea, unspecified: Secondary | ICD-10-CM | POA: Diagnosis not present

## 2012-09-01 DIAGNOSIS — K2270 Barrett's esophagus without dysplasia: Secondary | ICD-10-CM

## 2012-09-01 DIAGNOSIS — R7309 Other abnormal glucose: Secondary | ICD-10-CM

## 2012-09-01 DIAGNOSIS — R945 Abnormal results of liver function studies: Secondary | ICD-10-CM

## 2012-09-01 DIAGNOSIS — R079 Chest pain, unspecified: Secondary | ICD-10-CM

## 2012-09-01 DIAGNOSIS — R799 Abnormal finding of blood chemistry, unspecified: Secondary | ICD-10-CM

## 2012-09-01 DIAGNOSIS — Z1211 Encounter for screening for malignant neoplasm of colon: Secondary | ICD-10-CM

## 2012-09-01 DIAGNOSIS — R7989 Other specified abnormal findings of blood chemistry: Secondary | ICD-10-CM

## 2012-09-01 LAB — BASIC METABOLIC PANEL
Chloride: 106 mEq/L (ref 96–112)
GFR: 67.78 mL/min (ref >60.00)
Potassium: 4.1 mEq/L (ref 3.5–5.1)

## 2012-09-01 LAB — HEPATIC FUNCTION PANEL
ALT: 57 U/L — ABNORMAL HIGH (ref 0–53)
AST: 33 U/L (ref 0–37)
Bilirubin, Direct: 0.2 mg/dL (ref 0.0–0.3)
Total Bilirubin: 1 mg/dL (ref 0.3–1.2)

## 2012-09-01 LAB — LIPID PANEL
Cholesterol: 199 mg/dL (ref 0–200)
Total CHOL/HDL Ratio: 4
Triglycerides: 282 mg/dL — ABNORMAL HIGH (ref 0.0–149.0)
VLDL: 56.4 mg/dL — ABNORMAL HIGH (ref 0.0–40.0)

## 2012-09-01 LAB — AMYLASE: Amylase: 101 U/L (ref 27–131)

## 2012-09-01 LAB — LIPASE: Lipase: 21 U/L (ref 11.0–59.0)

## 2012-09-01 LAB — FOLATE: Folate: 13.6 ng/mL (ref >5.9)

## 2012-09-01 LAB — IBC PANEL: Iron: 111 ug/dL (ref 42–165)

## 2012-09-01 LAB — HEMOGLOBIN A1C: Hgb A1c MFr Bld: 5.4 % (ref 4.6–6.5)

## 2012-09-01 MED ORDER — MOVIPREP 100 G PO SOLR: 1.0000 | Freq: Once | ORAL | Status: DC

## 2012-09-01 NOTE — Progress Notes (Signed)
History of Present Illness:  This is a very complex 78 year old Caucasian male with a long history of chronic alcohol abuse.  He has had abnormal liver function test for greater than 10 years.  Previous hospitalizations have been associated with alcoholic pancreatitis with previous workup at [facility] over 10 years ago including endoscopic ultrasound, pancreatic biopsy, and calcific pancreatitis.  Also status post cholecystectomy is been on WelChol for several years because of post cholecystectomy diarrhea.  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  Last colonoscopy was in 2007.  He denies a current GI complaints on daily Nexium 40 mg.  Recent abdominal ultrasound exam showed a very echodense liver consistent with fatty infiltration, no evidence of ascites, pancreatic masses, or other abnormalities.  Lab data showed elevation of his transaminases approximately twice normal.  The patient denies pleuritis, mental status changes, abdominal pain, anorexia or weight loss.  He continues to drink over one bottle of wine every day. I have reviewed this patient's present history, medical and surgical past history, allergies and medications.     ROS:   All systems were reviewed and are negative unless otherwise stated in the HPI.   Blood pressure 146/84, pulse 66 and regular, and weight 216 with a BMI of 31.52.  General well developed well nourished patient in no acute distress, appearing their stated age Eyes PERRLA, no icterus, fundoscopic exam per opthamologist Skin no lesions noted Neck supple, no adenopathy, no thyroid enlargement, no tenderness Chest clear to percussion and auscultation Heart no significant murmurs, gallops or rubs noted Abdomen no hepatosplenomegaly masses or tenderness, BS normal.  Extremities no acute joint lesions, edema, phlebitis or evidence of cellulitis. Neurologic patient oriented x 3, cranial  nerves intact, no focal neurologic deficits noted. Psychological mental status normal and normal affect.  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  I have ordered hepatitis B. surface antigen, hepatitis C anybody, iron levels, amylase, lipase, prothrombin time, and ammonia level.  We will do followup endoscopy with dysplasia screening biopsies for his Barrett's mucosa, and he is to continue daily PPI therapy.  We also repeat his colonoscopy at his request.  He has no family history of colon cancer or colon polyps.  Physical exam today shows no evidence of cirrhosis.  Mental status is entirely normal and there is no asterixis.  Also the patient has no evidence of chronic malabsorption laboratory testing her exam.  His previous pancreatitis was trabeculated alcohol abuse.  No diagnosis found.

## 2012-09-01 NOTE — Patient Instructions (Addendum)
You have been scheduled for an endoscopy and colonoscopy with propofol. Please follow the written instructions given to you at your visit today. Please pick up your prep at the pharmacy within the next 1-3 days. If you use inhalers (even only as needed), please bring them with you on the day of your procedure.  Your physician has requested that you go to the basement for lab work before leaving today.

## 2012-09-02 ENCOUNTER — Telehealth: Payer: Self-pay | Admitting: Internal Medicine

## 2012-09-02 NOTE — Telephone Encounter (Signed)
Todd Wolf, please, inform patient that all labs good: they are in [place] w/comments Thx

## 2012-09-02 NOTE — Telephone Encounter (Signed)
Left detailed mess informing pt of below.  

## 2012-09-03 LAB — CARBOHYDRATE DEFICIENT TRANSFERRIN
%CDT: 1.5 % (ref <2.5)
CDT: 30 mg/L (ref 28.1–76.0)
Transferrin: 194 mg/dL (ref 188–341)

## 2012-09-10 ENCOUNTER — Encounter: Payer: Self-pay | Admitting: Gastroenterology

## 2012-09-10 ENCOUNTER — Ambulatory Visit (AMBULATORY_SURGERY_CENTER): Payer: Medicare Other | Admitting: Gastroenterology

## 2012-09-10 VITALS — BP 155/76 | HR 59 | Temp 97.4°F | Resp 26 | Ht 69.5 in | Wt 216.0 lb

## 2012-09-10 DIAGNOSIS — K2270 Barrett's esophagus without dysplasia: Secondary | ICD-10-CM | POA: Diagnosis not present

## 2012-09-10 DIAGNOSIS — K5730 Diverticulosis of large intestine without perforation or abscess without bleeding: Secondary | ICD-10-CM | POA: Diagnosis not present

## 2012-09-10 DIAGNOSIS — K449 Diaphragmatic hernia without obstruction or gangrene: Secondary | ICD-10-CM | POA: Diagnosis not present

## 2012-09-10 DIAGNOSIS — D126 Benign neoplasm of colon, unspecified: Secondary | ICD-10-CM

## 2012-09-10 DIAGNOSIS — Z1211 Encounter for screening for malignant neoplasm of colon: Secondary | ICD-10-CM | POA: Diagnosis not present

## 2012-09-10 DIAGNOSIS — K219 Gastro-esophageal reflux disease without esophagitis: Secondary | ICD-10-CM

## 2012-09-10 MED ORDER — SODIUM CHLORIDE 0.9 % IV SOLN: 500.0000 mL | INTRAVENOUS | Status: DC

## 2012-09-10 NOTE — Op Note (Signed)
[facility] [address] [place], [address]   ENDOSCOPY PROCEDURE REPORT  PATIENT: Scorpio, Fortin  MR#: [id number] BIRTHDATE: 1935/03/12 , 77  yrs. old GENDER: Male ENDOSCOPIST:Ayris Carano Hale Bogus, MD, Clement[facility] Graham REFERRED BY: Janeal Holmes, M.D. PROCEDURE DATE:  09/10/2012 PROCEDURE:   EGD w/ biopsy ASA CLASS:    Class II INDICATIONS: history of Barrett's esophagus. MEDICATION: There was residual sedation effect present from prior procedure and Propofol (Diprivan)80 mg IV TOPICAL ANESTHETIC:   Cetacaine Spray  DESCRIPTION OF PROCEDURE:   After the risks and benefits of the procedure were explained, informed consent was obtained.  The LB [id number]  endoscope was introduced through the mouth  and advanced to the second portion of the duodenum .  The instrument was slowly withdrawn as the mucosa was fully examined.      DUODENUM: The duodenal mucosa showed no abnormalities.  STOMACH: The mucosa of the stomach appeared normal.  ESOPHAGUS: There was evidence of Barrett's esophagus in the lower third of the esophagus.  Multiple biopsies were performed using cold forceps.  Sample sent for histology. 2 sets of 4 quadrant biopsies done foe exam.   Retroflexed views revealed a 5-6 CM. hIATIAL HERNIA NOTED .    The scope was then withdrawn from the patient and the procedure completed.  COMPLICATIONS: There were no complications.   ENDOSCOPIC IMPRESSION: 1.   The duodenal mucosa showed no abnormalities 2.   The mucosa of the stomach appeared normal 3.   There was evidence of Barrett's esophagus; multiple biopsies ...r/o dysplasia...  RECOMMENDATIONS: 1.  Await biopsy results 2.  Continue PPI 3.  F/U per path report   _______________________________ eSigned:  Mardella Layman, MD, [facility] 09/10/2012 3:08 PM

## 2012-09-10 NOTE — Progress Notes (Signed)
Called to room to assist during endoscopic procedure.  Patient ID and intended procedure confirmed with present staff. Received instructions for my participation in the procedure from the performing physician.  

## 2012-09-10 NOTE — Op Note (Signed)
[facility] [address] [place], [address]   COLONOSCOPY PROCEDURE REPORT  PATIENT: Todd, Wolf  MR#: [id number] BIRTHDATE: 1934/08/10 , 77  yrs. old GENDER: Male ENDOSCOPIST: Mardella Layman, MD, [facility] REFERRED BY: PROCEDURE DATE:  09/10/2012 PROCEDURE:   Colonoscopy with snare polypectomy ASA CLASS:   Class II INDICATIONS:Average risk patient for colon cancer. MEDICATIONS: propofol (Diprivan) 300mg  IV  DESCRIPTION OF PROCEDURE:   After the risks and benefits and of the procedure were explained, informed consent was obtained.        The LB [id number]  endoscope was introduced through the anus and advanced to the cecum, which was identified by both the appendix and ileocecal valve .  The quality of the prep was excellent, using MoviPrep .  The instrument was then slowly withdrawn as the colon was fully examined.     COLON FINDINGS: Moderate diverticulosis was noted in the descending colon and sigmoid colon.   A large smooth and firm sessile polyp, measuring 1.5 cm in size, was found at the cecum.  A polypectomy was performed using snare cautery.  The resection was complete and the polyp tissue was completely retrieved.   A smooth flat polyp ranging between 3-33mm in size was found at the hepatic flexure. Polypectomy was performed using hot snare.  The resections were complete and the polyp tissue was not retrieved.   The colon was otherwise normal.  There was no diverticulosis, inflammation, polyps or cancers unless previously stated.     Retroflexed views revealed no abnormalities.     The scope was then withdrawn from the patient and the procedure completed.  COMPLICATIONS: There were no complications. ENDOSCOPIC IMPRESSION: 1.   Moderate diverticulosis was noted in the descending colon and sigmoid colon 2.   Large sessile polyp, measuring 1.5 cm in size, was found at the cecum; polypectomy was performed using snare cautery 3.   Flat  polyp ranging between 3-44mm in size was found at the hepatic flexure; Polypectomy was performed using hot snare 4.   The colon was otherwise normal  RECOMMENDATIONS: 1.  Await biopsy results 2.  Repeat Colonoscopy in 1 year.   REPEAT EXAM:  cc:Linda Hedges.  Plotnikov, MD  _______________________________ eSigned:  Mardella Layman, MD, [facility] 09/10/2012 3:01 PM     PATIENT NAME:  Todd, Wolf MR#: [id number]

## 2012-09-10 NOTE — Progress Notes (Signed)
Patient did not experience any of the following events: a burn prior to discharge; a fall within the facility; wrong site/side/patient/procedure/implant event; or a hospital transfer or hospital admission upon discharge from the facility. (G8907) Patient did not have preoperative order for IV antibiotic SSI prophylaxis. (G8918)  

## 2012-09-10 NOTE — Progress Notes (Signed)
Report to pacu rn, vss, bbs=clear 

## 2012-09-10 NOTE — Patient Instructions (Addendum)
Discharge instructions given with verbal understanding. Handout on polyps,diverticulosis and a high fiber diet given. Resume previous medications. YOU HAD AN ENDOSCOPIC PROCEDURE TODAY AT THE [facility]: Refer to the procedure report that was given to you for any specific questions about what was found during the examination.  If the procedure report does not answer your questions, please call your gastroenterologist to clarify.  If you requested that your care partner not be given the details of your procedure findings, then the procedure report has been included in a sealed envelope for you to review at your convenience later.  YOU SHOULD EXPECT: Some feelings of bloating in the abdomen. Passage of more gas than usual.  Walking can help get rid of the air that was put into your GI tract during the procedure and reduce the bloating. If you had a lower endoscopy (such as a colonoscopy or flexible sigmoidoscopy) you may notice spotting of blood in your stool or on the toilet paper. If you underwent a bowel prep for your procedure, then you may not have a normal bowel movement for a few days.  DIET: Your first meal following the procedure should be a light meal and then it is ok to progress to your normal diet.  A half-sandwich or bowl of soup is an example of a good first meal.  Heavy or fried foods are harder to digest and may make you feel nauseous or bloated.  Likewise meals heavy in dairy and vegetables can cause extra gas to form and this can also increase the bloating.  Drink plenty of fluids but you should avoid alcoholic beverages for 24 hours.  ACTIVITY: Your care partner should take you home directly after the procedure.  You should plan to take it easy, moving slowly for the rest of the day.  You can resume normal activity the day after the procedure however you should NOT DRIVE or use heavy machinery for 24 hours (because of the sedation medicines used during the test).     SYMPTOMS TO REPORT IMMEDIATELY: A gastroenterologist can be reached at any hour.  During normal business hours, 8:30 AM to 5:00 PM Monday through Friday, call [phone].  After hours and on weekends, please call the GI answering service at [phone] who will take a message and have the physician on call contact you.   Following lower endoscopy (colonoscopy or flexible sigmoidoscopy):  Excessive amounts of blood in the stool  Significant tenderness or worsening of abdominal pains  Swelling of the abdomen that is new, acute  Fever of 100F or higher  Following upper endoscopy (EGD)  Vomiting of blood or coffee ground material  New chest pain or pain under the shoulder blades  Painful or persistently difficult swallowing  New shortness of breath  Fever of 100F or higher  Black, tarry-looking stools  FOLLOW UP: If any biopsies were taken you will be contacted by phone or by letter within the next 1-3 weeks.  Call your gastroenterologist if you have not heard about the biopsies in 3 weeks.  Our staff will call the home number listed on your records the next business day following your procedure to check on you and address any questions or concerns that you may have at that time regarding the information given to you following your procedure. This is a courtesy call and so if there is no answer at the home number and we have not heard from you through the emergency physician on call, we will  assume that you have returned to your regular daily activities without incident.  SIGNATURES/CONFIDENTIALITY: You and/or your care partner have signed paperwork which will be entered into your electronic medical record.  These signatures attest to the fact that that the information above on your After Visit Summary has been reviewed and is understood.  Full responsibility of the confidentiality of this discharge information lies with you and/or your care-partner.

## 2012-09-11 ENCOUNTER — Telehealth: Payer: Self-pay | Admitting: *Deleted

## 2012-09-11 NOTE — Telephone Encounter (Signed)
  Follow up Call-  Call back number 09/10/2012  Post procedure Call Back phone  # [phone]  Permission to leave phone message Yes     Patient questions:  Do you have a fever, pain , or abdominal swelling? no Pain Score  0 *  Have you tolerated food without any problems? yes  Have you been able to return to your normal activities? yes  Do you have any questions about your discharge instructions: Diet   no Medications  no Follow up visit  no  Do you have questions or concerns about your Care? no  Actions: * If pain score is 4 or above: No action needed, pain <4.

## 2012-09-16 ENCOUNTER — Encounter: Payer: Self-pay | Admitting: Gastroenterology

## 2013-02-10 DIAGNOSIS — L738 Other specified follicular disorders: Secondary | ICD-10-CM | POA: Diagnosis not present

## 2013-02-10 DIAGNOSIS — L578 Other skin changes due to chronic exposure to nonionizing radiation: Secondary | ICD-10-CM | POA: Diagnosis not present

## 2013-02-10 DIAGNOSIS — L819 Disorder of pigmentation, unspecified: Secondary | ICD-10-CM | POA: Diagnosis not present

## 2013-02-10 DIAGNOSIS — D235 Other benign neoplasm of skin of trunk: Secondary | ICD-10-CM | POA: Diagnosis not present

## 2013-02-10 DIAGNOSIS — L821 Other seborrheic keratosis: Secondary | ICD-10-CM | POA: Diagnosis not present

## 2013-03-15 ENCOUNTER — Other Ambulatory Visit: Payer: Self-pay | Admitting: Internal Medicine

## 2013-03-17 ENCOUNTER — Ambulatory Visit (INDEPENDENT_AMBULATORY_CARE_PROVIDER_SITE_OTHER): Payer: Medicare Other | Admitting: Internal Medicine

## 2013-03-17 ENCOUNTER — Other Ambulatory Visit (INDEPENDENT_AMBULATORY_CARE_PROVIDER_SITE_OTHER): Payer: Medicare Other

## 2013-03-17 ENCOUNTER — Encounter: Payer: Self-pay | Admitting: Internal Medicine

## 2013-03-17 VITALS — BP 150/82 | HR 80 | Temp 96.8°F | Resp 16 | Wt 213.0 lb

## 2013-03-17 DIAGNOSIS — M109 Gout, unspecified: Secondary | ICD-10-CM | POA: Diagnosis not present

## 2013-03-17 DIAGNOSIS — I10 Essential (primary) hypertension: Secondary | ICD-10-CM

## 2013-03-17 LAB — BASIC METABOLIC PANEL
BUN: 19 mg/dL (ref 6–23)
CO2: 30 mEq/L (ref 19–32)
Chloride: 106 mEq/L (ref 96–112)
Creatinine, Ser: 1.1 mg/dL (ref 0.4–1.5)
GFR: 70.59 mL/min (ref >60.00)
Glucose, Bld: 132 mg/dL — ABNORMAL HIGH (ref 70–99)
Potassium: 4.3 mEq/L (ref 3.5–5.1)
Sodium: 141 mEq/L (ref 135–145)

## 2013-03-17 LAB — HEPATIC FUNCTION PANEL
ALT: 52 U/L (ref 0–53)
AST: 32 U/L (ref 0–37)
Albumin: 4.2 g/dL (ref 3.5–5.2)
Alkaline Phosphatase: 105 U/L (ref 39–117)
Total Bilirubin: 1.3 mg/dL — ABNORMAL HIGH (ref 0.3–1.2)
Total Protein: 6.8 g/dL (ref 6.0–8.3)

## 2013-03-17 MED ORDER — NEOMYCIN-POLYMYXIN-HC 3.5-10000-1 OT SOLN: 3.0000 [drp] | Freq: Three times a day (TID) | OTIC | Status: DC

## 2013-03-17 NOTE — Progress Notes (Signed)
   Subjective:    HPI    The patient has been doing well overall without major physical or psychological issues going on lately. He has gained a little wt  SBP at home is 130-140  BP Readings from Last 3 Encounters:  03/17/13 150/82  09/10/12 155/76  09/01/12 146/84   Wt Readings from Last 3 Encounters:  03/17/13 213 lb (96.616 kg)  09/10/12 216 lb (97.977 kg)  09/01/12 216 lb 8 oz (98.204 kg)      Review of Systems  Constitutional: Positive for fatigue. Negative for fever and chills.  HENT: Positive for congestion, rhinorrhea and sinus pressure. Negative for nosebleeds.   Respiratory: Positive for cough and wheezing. Negative for chest tightness and shortness of breath.   Gastrointestinal: Negative for abdominal pain.  Musculoskeletal: Negative for joint swelling.       Objective:   Physical Exam  Constitutional: He is oriented to person, place, and time. He appears well-developed.  HENT:  Mouth/Throat: Oropharynx is clear and moist.  Eyes: Conjunctivae are normal. Pupils are equal, round, and reactive to light.  Neck: Normal range of motion. No JVD present. No thyromegaly present.  Cardiovascular: Normal rate, regular rhythm, normal heart sounds and intact distal pulses.  Exam reveals no gallop and no friction rub.   No murmur heard. Pulmonary/Chest: Effort normal and breath sounds normal. No respiratory distress. He has no wheezes. He has no rales. He exhibits no tenderness.  Abdominal: Soft. Bowel sounds are normal. He exhibits no distension and no mass. There is no tenderness. There is no rebound and no guarding.  Musculoskeletal: Normal range of motion. He exhibits no edema and no tenderness.  Lymphadenopathy:    He has no cervical adenopathy.  Neurological: He is alert and oriented to person, place, and time. He has normal reflexes. No cranial nerve deficit. He exhibits normal muscle tone. Coordination normal.  Skin: Skin is warm and dry. No rash noted.   Psychiatric: He has a normal mood and affect. His behavior is normal. Judgment and thought content normal.    Lab Results  Component Value Date   WBC 7.6 05/08/2012   HGB 15.9 05/08/2012   HCT 46.9 05/08/2012   PLT 204.0 05/08/2012   GLUCOSE 106* 09/01/2012   CHOL 199 09/01/2012   TRIG 282.0* 09/01/2012   HDL 51.90 09/01/2012   LDLDIRECT 104.0 09/01/2012   ALT 57* 09/01/2012   AST 33 09/01/2012   NA 141 09/01/2012   K 4.1 09/01/2012   CL 106 09/01/2012   CREATININE 1.1 09/01/2012   BUN 25* 09/01/2012   CO2 27 09/01/2012   TSH 0.62 05/08/2012   PSA 0.97 05/08/2012   INR 1.0 09/01/2012   HGBA1C 5.4 09/01/2012         Assessment & Plan:

## 2013-03-22 NOTE — Assessment & Plan Note (Signed)
Continue with current prescription therapy as reflected on the Med list.  

## 2013-03-22 NOTE — Assessment & Plan Note (Signed)
No relapse 

## 2013-05-05 ENCOUNTER — Other Ambulatory Visit: Payer: Self-pay | Admitting: Internal Medicine

## 2013-05-08 ENCOUNTER — Encounter: Payer: Self-pay | Admitting: Cardiology

## 2013-05-08 ENCOUNTER — Encounter: Payer: Self-pay | Admitting: Cardiovascular Disease

## 2013-05-10 ENCOUNTER — Other Ambulatory Visit: Payer: Self-pay | Admitting: Internal Medicine

## 2013-05-11 NOTE — Telephone Encounter (Signed)
Ok to Rf? Med was d/c 

## 2013-06-08 ENCOUNTER — Encounter: Payer: Self-pay | Admitting: Internal Medicine

## 2013-06-08 ENCOUNTER — Ambulatory Visit (INDEPENDENT_AMBULATORY_CARE_PROVIDER_SITE_OTHER): Payer: Medicare Other | Admitting: Internal Medicine

## 2013-06-08 ENCOUNTER — Other Ambulatory Visit (INDEPENDENT_AMBULATORY_CARE_PROVIDER_SITE_OTHER): Payer: Medicare Other

## 2013-06-08 VITALS — BP 140/70 | HR 80 | Temp 98.5°F | Resp 16 | Ht 70.5 in | Wt 217.0 lb

## 2013-06-08 DIAGNOSIS — Z23 Encounter for immunization: Secondary | ICD-10-CM

## 2013-06-08 DIAGNOSIS — R945 Abnormal results of liver function studies: Secondary | ICD-10-CM

## 2013-06-08 DIAGNOSIS — R739 Hyperglycemia, unspecified: Secondary | ICD-10-CM | POA: Insufficient documentation

## 2013-06-08 DIAGNOSIS — R635 Abnormal weight gain: Secondary | ICD-10-CM

## 2013-06-08 DIAGNOSIS — R7309 Other abnormal glucose: Secondary | ICD-10-CM | POA: Diagnosis not present

## 2013-06-08 DIAGNOSIS — Z0000 Encounter for general adult medical examination without abnormal findings: Secondary | ICD-10-CM

## 2013-06-08 DIAGNOSIS — N320 Bladder-neck obstruction: Secondary | ICD-10-CM | POA: Diagnosis not present

## 2013-06-08 LAB — BASIC METABOLIC PANEL
BUN: 17 mg/dL (ref 6–23)
CO2: 28 mEq/L (ref 19–32)
Calcium: 9.2 mg/dL (ref 8.4–10.5)
Chloride: 105 mEq/L (ref 96–112)
Glucose, Bld: 125 mg/dL — ABNORMAL HIGH (ref 70–99)
Potassium: 3.9 mEq/L (ref 3.5–5.1)

## 2013-06-08 LAB — CBC WITH DIFFERENTIAL/PLATELET
Basophils Absolute: 0 10*3/uL (ref 0.0–0.1)
Basophils Relative: 0.3 % (ref 0.0–3.0)
Eosinophils Absolute: 0.2 10*3/uL (ref 0.0–0.7)
MCHC: 34.2 g/dL (ref 30.0–36.0)
MCV: 91.6 fl (ref 78.0–100.0)
Monocytes Absolute: 0.6 10*3/uL (ref 0.1–1.0)
Monocytes Relative: 7.7 % (ref 3.0–12.0)
Neutrophils Relative %: 68.7 % (ref 43.0–77.0)
Platelets: 213 10*3/uL (ref 150.0–400.0)
RBC: 4.8 Mil/uL (ref 4.22–5.81)
WBC: 7.7 10*3/uL (ref 4.5–10.5)

## 2013-06-08 LAB — HEPATIC FUNCTION PANEL
ALT: 60 U/L — ABNORMAL HIGH (ref 0–53)
AST: 32 U/L (ref 0–37)
Albumin: 4.5 g/dL (ref 3.5–5.2)
Total Bilirubin: 1.2 mg/dL (ref 0.3–1.2)
Total Protein: 6.7 g/dL (ref 6.0–8.3)

## 2013-06-08 LAB — URINALYSIS
Hgb urine dipstick: NEGATIVE
Ketones, ur: NEGATIVE
Leukocytes, UA: NEGATIVE
Nitrite: NEGATIVE
Urine Glucose: NEGATIVE
Urobilinogen, UA: 0.2 (ref 0.0–1.0)
pH: 5.5 (ref 5.0–8.0)

## 2013-06-08 LAB — HEMOGLOBIN A1C: Hgb A1c MFr Bld: 5.4 % (ref 4.6–6.5)

## 2013-06-08 LAB — PSA: PSA: 0.89 ng/mL (ref 0.10–4.00)

## 2013-06-08 MED ORDER — SILODOSIN 8 MG PO CAPS: 8.0000 mg | ORAL_CAPSULE | Freq: Every day | ORAL | Status: DC

## 2013-06-08 NOTE — Progress Notes (Signed)
Pre visit review using our clinic review tool, if applicable. No additional management support is needed unless otherwise documented below in the visit note. 

## 2013-06-08 NOTE — Progress Notes (Signed)
   Subjective:    HPI    The patient has been doing well overall without major physical or psychological issues going on lately. He has gained a little wt  Rosanne Ashing is going to a trip to [place]  SBP at home is 130-140  BP Readings from Last 3 Encounters:  06/08/13 150/88  03/17/13 150/82  09/10/12 155/76   Wt Readings from Last 3 Encounters:  06/08/13 217 lb (98.431 kg)  03/17/13 213 lb (96.616 kg)  09/10/12 216 lb (97.977 kg)      Review of Systems  Constitutional: Positive for fatigue. Negative for fever and chills.  HENT: Positive for congestion, rhinorrhea and sinus pressure. Negative for nosebleeds.   Respiratory: Positive for cough and wheezing. Negative for chest tightness and shortness of breath.   Gastrointestinal: Negative for abdominal pain.  Musculoskeletal: Negative for joint swelling.       Objective:   Physical Exam  Constitutional: He is oriented to person, place, and time. He appears well-developed.  HENT:  Mouth/Throat: Oropharynx is clear and moist.  Eyes: Conjunctivae are normal. Pupils are equal, round, and reactive to light.  Neck: Normal range of motion. No JVD present. No thyromegaly present.  Cardiovascular: Normal rate, regular rhythm, normal heart sounds and intact distal pulses.  Exam reveals no gallop and no friction rub.   No murmur heard. Pulmonary/Chest: Effort normal and breath sounds normal. No respiratory distress. He has no wheezes. He has no rales. He exhibits no tenderness.  Abdominal: Soft. Bowel sounds are normal. He exhibits no distension and no mass. There is no tenderness. There is no rebound and no guarding.  Musculoskeletal: Normal range of motion. He exhibits no edema and no tenderness.  Lymphadenopathy:    He has no cervical adenopathy.  Neurological: He is alert and oriented to person, place, and time. He has normal reflexes. No cranial nerve deficit. He exhibits normal muscle tone. Coordination normal.  Skin: Skin is warm  and dry. No rash noted.  Psychiatric: He has a normal mood and affect. His behavior is normal. Judgment and thought content normal.    Lab Results  Component Value Date   WBC 7.6 05/08/2012   HGB 15.9 05/08/2012   HCT 46.9 05/08/2012   PLT 204.0 05/08/2012   GLUCOSE 132* 03/17/2013   CHOL 199 09/01/2012   TRIG 282.0* 09/01/2012   HDL 51.90 09/01/2012   LDLDIRECT 104.0 09/01/2012   ALT 52 03/17/2013   AST 32 03/17/2013   NA 141 03/17/2013   K 4.3 03/17/2013   CL 106 03/17/2013   CREATININE 1.1 03/17/2013   BUN 19 03/17/2013   CO2 30 03/17/2013   TSH 0.62 05/08/2012   PSA 0.97 05/08/2012   INR 1.0 09/01/2012   HGBA1C 5.4 09/01/2012         Assessment & Plan:

## 2013-06-08 NOTE — Assessment & Plan Note (Signed)
Try Rapaflo

## 2013-06-08 NOTE — Assessment & Plan Note (Signed)
Continue with current prescription therapy as reflected on the Med list.  

## 2013-06-08 NOTE — Assessment & Plan Note (Signed)
TSH 

## 2013-07-02 ENCOUNTER — Other Ambulatory Visit: Payer: Self-pay | Admitting: Internal Medicine

## 2013-07-15 ENCOUNTER — Encounter: Payer: Self-pay | Admitting: Internal Medicine

## 2013-07-29 ENCOUNTER — Other Ambulatory Visit: Payer: Self-pay | Admitting: Internal Medicine

## 2013-08-17 DIAGNOSIS — L819 Disorder of pigmentation, unspecified: Secondary | ICD-10-CM | POA: Diagnosis not present

## 2013-08-17 DIAGNOSIS — L578 Other skin changes due to chronic exposure to nonionizing radiation: Secondary | ICD-10-CM | POA: Diagnosis not present

## 2013-08-17 DIAGNOSIS — L820 Inflamed seborrheic keratosis: Secondary | ICD-10-CM | POA: Diagnosis not present

## 2013-08-17 DIAGNOSIS — L738 Other specified follicular disorders: Secondary | ICD-10-CM | POA: Diagnosis not present

## 2013-09-08 ENCOUNTER — Encounter: Payer: Self-pay | Admitting: Internal Medicine

## 2013-10-19 ENCOUNTER — Telehealth: Payer: Self-pay | Admitting: *Deleted

## 2013-10-19 ENCOUNTER — Ambulatory Visit (AMBULATORY_SURGERY_CENTER): Payer: Self-pay | Admitting: *Deleted

## 2013-10-19 ENCOUNTER — Telehealth: Payer: Self-pay | Admitting: Internal Medicine

## 2013-10-19 VITALS — Ht 70.5 in | Wt 217.8 lb

## 2013-10-19 DIAGNOSIS — K2270 Barrett's esophagus without dysplasia: Secondary | ICD-10-CM

## 2013-10-19 DIAGNOSIS — Z86010 Personal history of colonic polyps: Secondary | ICD-10-CM

## 2013-10-19 MED ORDER — MOVIPREP 100 G PO SOLR: ORAL | Status: DC

## 2013-10-19 NOTE — Telephone Encounter (Signed)
Noted. Thanks.

## 2013-10-19 NOTE — Telephone Encounter (Signed)
Previous Dr.Patterson patient. Patient is scheduled for EGD and Colon on Monday 11/02/13. Last EGD/Colon was 08/2012. Result letters mailed to patient states recall Colon 1 year(polyp) and recall EGD 2 years(Barrett's). Patient was scheduled for both,recall states Endo/colon due 08/2013. Patient is requesting to go a head with EGD now, since he is going to have colon and due to "my age". Please review. Okay for recall EGD now? Thank you, Stephanne Greeley

## 2013-10-19 NOTE — Telephone Encounter (Signed)
Both exams together at this time would be appropriate. Okay to Schedule both exams together, as they are. Thanks

## 2013-10-19 NOTE — Telephone Encounter (Signed)
Spoke with pharmacy, patient's insurance does not cover MoviPrep, cost $103. Explained to her that Dr.Perry uses that prep and if patient can not afford this have the patient call us back.

## 2013-10-19 NOTE — Progress Notes (Signed)
Patient denies any allergies to eggs or soy. Patient denies any problems with anesthesia. No oxygen use at home, no diet/wt loss pills per patient. EMMI instructions given to patient.

## 2013-11-02 ENCOUNTER — Encounter: Payer: Self-pay | Admitting: Internal Medicine

## 2013-11-02 ENCOUNTER — Ambulatory Visit (AMBULATORY_SURGERY_CENTER): Payer: Medicare Other | Admitting: Internal Medicine

## 2013-11-02 VITALS — BP 154/92 | HR 63 | Temp 96.6°F | Resp 19 | Ht 70.5 in | Wt 217.0 lb

## 2013-11-02 DIAGNOSIS — K861 Other chronic pancreatitis: Secondary | ICD-10-CM | POA: Diagnosis not present

## 2013-11-02 DIAGNOSIS — K209 Esophagitis, unspecified without bleeding: Secondary | ICD-10-CM | POA: Diagnosis not present

## 2013-11-02 DIAGNOSIS — Z86010 Personal history of colonic polyps: Secondary | ICD-10-CM

## 2013-11-02 DIAGNOSIS — I10 Essential (primary) hypertension: Secondary | ICD-10-CM | POA: Diagnosis not present

## 2013-11-02 DIAGNOSIS — K2270 Barrett's esophagus without dysplasia: Secondary | ICD-10-CM

## 2013-11-02 DIAGNOSIS — K219 Gastro-esophageal reflux disease without esophagitis: Secondary | ICD-10-CM | POA: Diagnosis not present

## 2013-11-02 DIAGNOSIS — D126 Benign neoplasm of colon, unspecified: Secondary | ICD-10-CM | POA: Diagnosis not present

## 2013-11-02 MED ORDER — SODIUM CHLORIDE 0.9 % IV SOLN: 500.0000 mL | INTRAVENOUS | Status: DC

## 2013-11-02 NOTE — Progress Notes (Signed)
Called to room to assist during endoscopic procedure.  Patient ID and intended procedure confirmed with present staff. Received instructions for my participation in the procedure from the performing physician.  

## 2013-11-02 NOTE — Patient Instructions (Addendum)
YOU HAD AN ENDOSCOPIC PROCEDURE TODAY AT THE [facility]: Refer to the procedure report that was given to you for any specific questions about what was found during the examination.  If the procedure report does not answer your questions, please call your gastroenterologist to clarify.  If you requested that your care partner not be given the details of your procedure findings, then the procedure report has been included in a sealed envelope for you to review at your convenience later.  YOU SHOULD EXPECT: Some feelings of bloating in the abdomen. Passage of more gas than usual.  Walking can help get rid of the air that was put into your GI tract during the procedure and reduce the bloating. If you had a lower endoscopy (such as a colonoscopy or flexible sigmoidoscopy) you may notice spotting of blood in your stool or on the toilet paper. If you underwent a bowel prep for your procedure, then you may not have a normal bowel movement for a few days.  DIET: Your first meal following the procedure should be a light meal and then it is ok to progress to your normal diet.  A half-sandwich or bowl of soup is an example of a good first meal.  Heavy or fried foods are harder to digest and may make you feel nauseous or bloated.  Likewise meals heavy in dairy and vegetables can cause extra gas to form and this can also increase the bloating.  Drink plenty of fluids but you should avoid alcoholic beverages for 24 hours.  ACTIVITY: Your care partner should take you home directly after the procedure.  You should plan to take it easy, moving slowly for the rest of the day.  You can resume normal activity the day after the procedure however you should NOT DRIVE or use heavy machinery for 24 hours (because of the sedation medicines used during the test).    SYMPTOMS TO REPORT IMMEDIATELY: A gastroenterologist can be reached at any hour.  During normal business hours, 8:30 AM to 5:00 PM Monday through Friday,  call [phone].  After hours and on weekends, please call the GI answering service at [phone] who will take a message and have the physician on call contact you.   Following lower endoscopy (colonoscopy or flexible sigmoidoscopy):  Excessive amounts of blood in the stool  Significant tenderness or worsening of abdominal pains  Swelling of the abdomen that is new, acute  Fever of 100F or higher  Following upper endoscopy (EGD)  Vomiting of blood or coffee ground material  New chest pain or pain under the shoulder blades  Painful or persistently difficult swallowing  New shortness of breath  Fever of 100F or higher  Black, tarry-looking stools  FOLLOW UP: If any biopsies were taken you will be contacted by phone or by letter within the next 1-3 weeks.  Call your gastroenterologist if you have not heard about the biopsies in 3 weeks.  Our staff will call the home number listed on your records the next business day following your procedure to check on you and address any questions or concerns that you may have at that time regarding the information given to you following your procedure. This is a courtesy call and so if there is no answer at the home number and we have not heard from you through the emergency physician on call, we will assume that you have returned to your regular daily activities without incident.  SIGNATURES/CONFIDENTIALITY: You and/or your care   partner have signed paperwork which will be entered into your electronic medical record.  These signatures attest to the fact that that the information above on your After Visit Summary has been reviewed and is understood.  Full responsibility of the confidentiality of this discharge information lies with you and/or your care-partner.  Polyps, hemorrhoids, Barrett's, GERD-handouts given  Repeat colonoscopy in 3 years-2018  Repeat EGD in 3 years if no dysplasia  Continue nexium.

## 2013-11-02 NOTE — Op Note (Signed)
[facility] [place]  [address]. [place], [address]   COLONOSCOPY PROCEDURE REPORT  PATIENT: Todd Wolf, Todd Wolf  MR#: [id number] BIRTHDATE: [birth year] , 78  yrs. old GENDER: Male ENDOSCOPIST: Eustace Quail, MD REFERRED WC:[id number] Program Recall PROCEDURE DATE:  11/02/2013 PROCEDURE:   Colonoscopy with snare polypectomy x 2 First Screening Colonoscopy - Avg.  risk and is 50 yrs.  old or older - No.  Prior Negative Screening - Now for repeat screening. N/A  History of Adenoma - Now for follow-up colonoscopy & has been > or = to 3 yrs.  No.  It has been less than 3 yrs since last colonoscopy.  Medical reason.  Polyps Removed Today? Yes. ASA CLASS:   Class II INDICATIONS:Patient's personal history of adenomatous colon polyps. Multiple prior exams. Last examination March 2014 with 1.5 cmsessile cecal adenoma removed piecemeal. MEDICATIONS: MAC sedation, administered by CRNA and propofol (Diprivan) 400mg  IV  DESCRIPTION OF PROCEDURE:   After the risks benefits and alternatives of the procedure were thoroughly explained, informed consent was obtained.  A digital rectal exam revealed no abnormalities of the rectum.   The LB [id number]  endoscope was introduced through the anus and advanced to the cecum, which was identified by both the appendix and ileocecal valve. No adverse events experienced.   The quality of the prep was excellent, using MoviPrep  The instrument was then slowly withdrawn as the colon was fully examined.  COLON FINDINGS: Two polyps ranging between 3-52mm in size were found at the cecum and in the ascending colon.  A polypectomy was performed with a cold snare.  The resection was complete and the polyp tissue was completely retrieved.   The colon mucosa was otherwise normal. Prior polypectomy site was clean.  Retroflexed views revealed internal hemorrhoids. The time to cecum=2 minutes 47 seconds.  Withdrawal time=17 minutes 08 seconds.  The  scope was withdrawn and the procedure completed. COMPLICATIONS: There were no complications.  ENDOSCOPIC IMPRESSION: 1.   Two small polyps  were found at the cecum and in the ascending colon; polypectomy was performed with a cold snare 2.   The colon mucosa was otherwise normal  RECOMMENDATIONS: 1. Repeat Colonoscopy in 3 years.   eSigned:  Eustace Quail, MD 11/02/2013 11:55 AM   cc: [facility] [place].  Plotnikov, MD and The Patient

## 2013-11-02 NOTE — Op Note (Signed)
[place]  [employer] [address] [place], [id number]   ENDOSCOPY PROCEDURE REPORT  PATIENT: Todd Wolf, Todd Wolf  MR#: [id number] BIRTHDATE: 1935/05/11 , 78  yrs. old GENDER: Male ENDOSCOPIST: Eustace Quail, MD REFERRED BY:  Surveillance Program Recall PROCEDURE DATE:  11/02/2013 PROCEDURE:  EGD w/ biopsy ASA CLASS:     Class II INDICATIONS:  Barrett's surveillance. Last exam March 2014. Nondysplastic Barrett's MEDICATIONS: MAC sedation, administered by CRNA, Propofol (Diprivan), and propofol (Diprivan) 200mg  IV TOPICAL ANESTHETIC: none  DESCRIPTION OF PROCEDURE: After the risks benefits and alternatives of the procedure were thoroughly explained, informed consent was obtained.  The LB [id number] endoscope was introduced through the mouth and advanced to the second portion of the duodenum. Without limitations.  The instrument was slowly withdrawn as the mucosa was fully examined.      The esophagus revealed Barrett's type mucosa the distal 5 cm.  This was evaluated with both white light and narrowed band imaging.  No inflammation or nodules.  Multiple biopsies taken.  The stomach revealed benign fundic gland type polyps but was otherwise normal. The duodenum was normal.  Retroflexed views revealed a hiatal hernia.     The scope was then withdrawn from the patient and the procedure completed.  COMPLICATIONS: There were no complications. ENDOSCOPIC IMPRESSION: 1. GERD with Barrett's esophagus status post biopsies  RECOMMENDATIONS: 1. REPEAT SURVEILLANCE EGD IN 3 YEARS, if no dysplasia on biopsies 2. Continue PPI therapy  REPEAT EXAM:  eSigned:  Eustace Quail, MD 11/02/2013 12:00 PM   GE:ZMOQ V.  Plotnikov, MD and The Patient

## 2013-11-02 NOTE — Progress Notes (Signed)
Patient denies any allergies to eggs or soy. Patient denies any problems with anesthesia/sedation. Patient denies any oxygen use at home and does not take any diet/weight loss medications.  

## 2013-11-03 ENCOUNTER — Telehealth: Payer: Self-pay

## 2013-11-03 NOTE — Telephone Encounter (Signed)
  Follow up Call-  Call back number 11/02/2013 09/10/2012  Post procedure Call Back phone  # [phone]  Permission to leave phone message Yes Yes     Patient questions:  Do you have a fever, pain , or abdominal swelling? no Pain Score  0 *  Have you tolerated food without any problems? yes  Have you been able to return to your normal activities? yes  Do you have any questions about your discharge instructions: Diet   no Medications  no Follow up visit  no  Do you have questions or concerns about your Care? no  Actions: * If pain score is 4 or above: No action needed, pain <4.   No problems per the pt. maw

## 2013-11-05 ENCOUNTER — Encounter: Payer: Self-pay | Admitting: Internal Medicine

## 2013-11-24 ENCOUNTER — Other Ambulatory Visit: Payer: Self-pay | Admitting: Internal Medicine

## 2013-12-22 ENCOUNTER — Other Ambulatory Visit: Payer: Self-pay | Admitting: Internal Medicine

## 2014-01-10 ENCOUNTER — Other Ambulatory Visit: Payer: Self-pay | Admitting: Internal Medicine

## 2014-02-07 ENCOUNTER — Encounter: Payer: Self-pay | Admitting: Internal Medicine

## 2014-02-15 DIAGNOSIS — L820 Inflamed seborrheic keratosis: Secondary | ICD-10-CM | POA: Diagnosis not present

## 2014-02-15 DIAGNOSIS — L819 Disorder of pigmentation, unspecified: Secondary | ICD-10-CM | POA: Diagnosis not present

## 2014-02-15 DIAGNOSIS — D235 Other benign neoplasm of skin of trunk: Secondary | ICD-10-CM | POA: Diagnosis not present

## 2014-02-15 DIAGNOSIS — L578 Other skin changes due to chronic exposure to nonionizing radiation: Secondary | ICD-10-CM | POA: Diagnosis not present

## 2014-04-27 ENCOUNTER — Other Ambulatory Visit: Payer: Self-pay | Admitting: Internal Medicine

## 2014-05-10 ENCOUNTER — Other Ambulatory Visit: Payer: Self-pay | Admitting: Internal Medicine

## 2014-05-31 ENCOUNTER — Other Ambulatory Visit: Payer: Self-pay | Admitting: Internal Medicine

## 2014-06-09 ENCOUNTER — Ambulatory Visit (INDEPENDENT_AMBULATORY_CARE_PROVIDER_SITE_OTHER): Payer: Medicare Other | Admitting: Internal Medicine

## 2014-06-09 ENCOUNTER — Other Ambulatory Visit (INDEPENDENT_AMBULATORY_CARE_PROVIDER_SITE_OTHER): Payer: Medicare Other

## 2014-06-09 ENCOUNTER — Encounter: Payer: Self-pay | Admitting: Internal Medicine

## 2014-06-09 VITALS — BP 130/90 | HR 67 | Temp 98.2°F | Ht 70.0 in | Wt 214.0 lb

## 2014-06-09 DIAGNOSIS — N320 Bladder-neck obstruction: Secondary | ICD-10-CM | POA: Diagnosis not present

## 2014-06-09 DIAGNOSIS — N401 Enlarged prostate with lower urinary tract symptoms: Secondary | ICD-10-CM | POA: Diagnosis not present

## 2014-06-09 DIAGNOSIS — Z0000 Encounter for general adult medical examination without abnormal findings: Secondary | ICD-10-CM

## 2014-06-09 DIAGNOSIS — R635 Abnormal weight gain: Secondary | ICD-10-CM

## 2014-06-09 DIAGNOSIS — T753XXA Motion sickness, initial encounter: Secondary | ICD-10-CM

## 2014-06-09 DIAGNOSIS — R1031 Right lower quadrant pain: Secondary | ICD-10-CM

## 2014-06-09 DIAGNOSIS — I10 Essential (primary) hypertension: Secondary | ICD-10-CM

## 2014-06-09 DIAGNOSIS — R739 Hyperglycemia, unspecified: Secondary | ICD-10-CM

## 2014-06-09 DIAGNOSIS — E785 Hyperlipidemia, unspecified: Secondary | ICD-10-CM | POA: Diagnosis not present

## 2014-06-09 DIAGNOSIS — M1000 Idiopathic gout, unspecified site: Secondary | ICD-10-CM

## 2014-06-09 DIAGNOSIS — R945 Abnormal results of liver function studies: Secondary | ICD-10-CM | POA: Diagnosis not present

## 2014-06-09 DIAGNOSIS — R109 Unspecified abdominal pain: Secondary | ICD-10-CM | POA: Insufficient documentation

## 2014-06-09 LAB — CBC WITH DIFFERENTIAL/PLATELET
BASOS ABS: 0 10*3/uL (ref 0.0–0.1)
BASOS PCT: 0.3 % (ref 0.0–3.0)
Eosinophils Absolute: 0.2 10*3/uL (ref 0.0–0.7)
Eosinophils Relative: 3 % (ref 0.0–5.0)
HEMATOCRIT: 47.8 % (ref 39.0–52.0)
HEMOGLOBIN: 16 g/dL (ref 13.0–17.0)
LYMPHS ABS: 1.8 10*3/uL (ref 0.7–4.0)
LYMPHS PCT: 22.5 % (ref 12.0–46.0)
MCHC: 33.5 g/dL (ref 30.0–36.0)
MCV: 92.1 fl (ref 78.0–100.0)
MONOS PCT: 9.3 % (ref 3.0–12.0)
Monocytes Absolute: 0.8 10*3/uL (ref 0.1–1.0)
NEUTROS ABS: 5.3 10*3/uL (ref 1.4–7.7)
Neutrophils Relative %: 64.9 % (ref 43.0–77.0)
Platelets: 210 10*3/uL (ref 150.0–400.0)
RBC: 5.19 Mil/uL (ref 4.22–5.81)
RDW: 13.3 % (ref 11.5–15.5)
WBC: 8.2 10*3/uL (ref 4.0–10.5)

## 2014-06-09 LAB — URINALYSIS
BILIRUBIN URINE: NEGATIVE
Hgb urine dipstick: NEGATIVE
KETONES UR: NEGATIVE
LEUKOCYTES UA: NEGATIVE
Nitrite: NEGATIVE
Specific Gravity, Urine: 1.025 (ref 1.000–1.030)
TOTAL PROTEIN, URINE-UPE24: NEGATIVE
Urine Glucose: NEGATIVE
Urobilinogen, UA: 0.2 (ref 0.0–1.0)
pH: 5.5 (ref 5.0–8.0)

## 2014-06-09 LAB — HEPATIC FUNCTION PANEL
ALK PHOS: 129 U/L — AB (ref 39–117)
ALT: 77 U/L — ABNORMAL HIGH (ref 0–53)
AST: 43 U/L — ABNORMAL HIGH (ref 0–37)
Albumin: 4.5 g/dL (ref 3.5–5.2)
Bilirubin, Direct: 0.3 mg/dL (ref 0.0–0.3)
TOTAL PROTEIN: 7.1 g/dL (ref 6.0–8.3)
Total Bilirubin: 1.3 mg/dL — ABNORMAL HIGH (ref 0.2–1.2)

## 2014-06-09 LAB — TSH: TSH: 0.56 u[IU]/mL (ref 0.35–4.50)

## 2014-06-09 LAB — LIPID PANEL
CHOL/HDL RATIO: 4
Cholesterol: 215 mg/dL — ABNORMAL HIGH (ref 0–200)
HDL: 52.8 mg/dL (ref >39.00)
NONHDL: 162.2
TRIGLYCERIDES: 244 mg/dL — AB (ref 0.0–149.0)
VLDL: 48.8 mg/dL — AB (ref 0.0–40.0)

## 2014-06-09 LAB — BASIC METABOLIC PANEL
BUN: 21 mg/dL (ref 6–23)
CO2: 26 meq/L (ref 19–32)
CREATININE: 1 mg/dL (ref 0.4–1.5)
Calcium: 9.5 mg/dL (ref 8.4–10.5)
Chloride: 103 mEq/L (ref 96–112)
GFR: 76.01 mL/min (ref >60.00)
Glucose, Bld: 94 mg/dL (ref 70–99)
Potassium: 4.2 mEq/L (ref 3.5–5.1)
Sodium: 141 mEq/L (ref 135–145)

## 2014-06-09 LAB — PSA: PSA: 0.82 ng/mL (ref 0.10–4.00)

## 2014-06-09 LAB — SEDIMENTATION RATE: Sed Rate: 5 mm/hr (ref 0–22)

## 2014-06-09 MED ORDER — SCOPOLAMINE 1 MG/3DAYS TD PT72: 1.0000 | MEDICATED_PATCH | TRANSDERMAL | Status: DC

## 2014-06-09 MED ORDER — FEBUXOSTAT 40 MG PO TABS: 40.0000 mg | ORAL_TABLET | Freq: Every day | ORAL | Status: DC

## 2014-06-09 MED ORDER — DUTASTERIDE 0.5 MG PO CAPS: 0.5000 mg | ORAL_CAPSULE | Freq: Every day | ORAL | Status: DC

## 2014-06-09 MED ORDER — ESOMEPRAZOLE MAGNESIUM 40 MG PO CPDR: 40.0000 mg | DELAYED_RELEASE_CAPSULE | Freq: Every day | ORAL | Status: DC

## 2014-06-09 MED ORDER — AMLODIPINE BESYLATE 5 MG PO TABS: 5.0000 mg | ORAL_TABLET | Freq: Every day | ORAL | Status: DC

## 2014-06-09 MED ORDER — SILODOSIN 8 MG PO CAPS: 8.0000 mg | ORAL_CAPSULE | Freq: Every day | ORAL | Status: DC

## 2014-06-09 MED ORDER — COLESEVELAM HCL 625 MG PO TABS: ORAL_TABLET | ORAL | Status: DC

## 2014-06-09 MED ORDER — BENAZEPRIL HCL 40 MG PO TABS: 40.0000 mg | ORAL_TABLET | Freq: Every day | ORAL | Status: DC

## 2014-06-09 NOTE — Assessment & Plan Note (Signed)
Scop patch Rx 

## 2014-06-09 NOTE — Patient Instructions (Signed)
Preventive Care for Adults A healthy lifestyle and preventive care can promote health and wellness. Preventive health guidelines for men include the following key practices:  A routine yearly physical is a good way to check with your health care provider about your health and preventative screening. It is a chance to share any concerns and updates on your health and to receive a thorough exam.  Visit your dentist for a routine exam and preventative care every 6 months. Brush your teeth twice a day and floss once a day. Good oral hygiene prevents tooth decay and gum disease.  The frequency of eye exams is based on your age, health, family medical history, use of contact lenses, and other factors. Follow your health care provider's recommendations for frequency of eye exams.  Eat a healthy diet. Foods such as vegetables, fruits, whole grains, low-fat dairy products, and lean protein foods contain the nutrients you need without too many calories. Decrease your intake of foods high in solid fats, added sugars, and salt. Eat the right amount of calories for you.Get information about a proper diet from your health care provider, if necessary.  Regular physical exercise is one of the most important things you can do for your health. Most adults should get at least 150 minutes of moderate-intensity exercise (any activity that increases your heart rate and causes you to sweat) each week. In addition, most adults need muscle-strengthening exercises on 2 or more days a week.  Maintain a healthy weight. The body mass index (BMI) is a screening tool to identify possible weight problems. It provides an estimate of body fat based on height and weight. Your health care provider can find your BMI and can help you achieve or maintain a healthy weight.For adults 20 years and older:  A BMI below 18.5 is considered underweight.  A BMI of 18.5 to 24.9 is normal.  A BMI of 25 to 29.9 is considered overweight.  A BMI  of 30 and above is considered obese.  Maintain normal blood lipids and cholesterol levels by exercising and minimizing your intake of saturated fat. Eat a balanced diet with plenty of fruit and vegetables. Blood tests for lipids and cholesterol should begin at age 50 and be repeated every 5 years. If your lipid or cholesterol levels are high, you are over 50, or you are at high risk for heart disease, you may need your cholesterol levels checked more frequently.Ongoing high lipid and cholesterol levels should be treated with medicines if diet and exercise are not working.  If you smoke, find out from your health care provider how to quit. If you do not use tobacco, do not start.  Lung cancer screening is recommended for adults aged 73-80 years who are at high risk for developing lung cancer because of a history of smoking. A yearly low-dose CT scan of the lungs is recommended for people who have at least a 30-pack-year history of smoking and are a current smoker or have quit within the past 15 years. A pack year of smoking is smoking an average of 1 pack of cigarettes a day for 1 year (for example: 1 pack a day for 30 years or 2 packs a day for 15 years). Yearly screening should continue until the smoker has stopped smoking for at least 15 years. Yearly screening should be stopped for people who develop a health problem that would prevent them from having lung cancer treatment.  If you choose to drink alcohol, do not have more than  2 drinks per day. One drink is considered to be 12 ounces (355 mL) of beer, 5 ounces (148 mL) of wine, or 1.5 ounces (44 mL) of liquor.  Avoid use of street drugs. Do not share needles with anyone. Ask for help if you need support or instructions about stopping the use of drugs.  High blood pressure causes heart disease and increases the risk of stroke. Your blood pressure should be checked at least every 1-2 years. Ongoing high blood pressure should be treated with  medicines, if weight loss and exercise are not effective.  If you are 45-79 years old, ask your health care provider if you should take aspirin to prevent heart disease.  Diabetes screening involves taking a blood sample to check your fasting blood sugar level. This should be done once every 3 years, after age 45, if you are within normal weight and without risk factors for diabetes. Testing should be considered at a younger age or be carried out more frequently if you are overweight and have at least 1 risk factor for diabetes.  Colorectal cancer can be detected and often prevented. Most routine colorectal cancer screening begins at the age of 50 and continues through age 75. However, your health care provider may recommend screening at an earlier age if you have risk factors for colon cancer. On a yearly basis, your health care provider may provide home test kits to check for hidden blood in the stool. Use of a small camera at the end of a tube to directly examine the colon (sigmoidoscopy or colonoscopy) can detect the earliest forms of colorectal cancer. Talk to your health care provider about this at age 50, when routine screening begins. Direct exam of the colon should be repeated every 5-10 years through age 75, unless early forms of precancerous polyps or small growths are found.  People who are at an increased risk for hepatitis B should be screened for this virus. You are considered at high risk for hepatitis B if:  You were born in a country where hepatitis B occurs often. Talk with your health care provider about which countries are considered high risk.  Your parents were born in a high-risk country and you have not received a shot to protect against hepatitis B (hepatitis B vaccine).  You have HIV or AIDS.  You use needles to inject street drugs.  You live with, or have sex with, someone who has hepatitis B.  You are a man who has sex with other men (MSM).  You get hemodialysis  treatment.  You take certain medicines for conditions such as cancer, organ transplantation, and autoimmune conditions.  Hepatitis C blood testing is recommended for all people born from 1945 through 1965 and any individual with known risks for hepatitis C.  Practice safe sex. Use condoms and avoid high-risk sexual practices to reduce the spread of sexually transmitted infections (STIs). STIs include gonorrhea, chlamydia, syphilis, trichomonas, herpes, HPV, and human immunodeficiency virus (HIV). Herpes, HIV, and HPV are viral illnesses that have no cure. They can result in disability, cancer, and death.  If you are at risk of being infected with HIV, it is recommended that you take a prescription medicine daily to prevent HIV infection. This is called preexposure prophylaxis (PrEP). You are considered at risk if:  You are a man who has sex with other men (MSM) and have other risk factors.  You are a heterosexual man, are sexually active, and are at increased risk for HIV infection.    You take drugs by injection.  You are sexually active with a partner who has HIV.  Talk with your health care provider about whether you are at high risk of being infected with HIV. If you choose to begin PrEP, you should first be tested for HIV. You should then be tested every 3 months for as long as you are taking PrEP.  A one-time screening for abdominal aortic aneurysm (AAA) and surgical repair of large AAAs by ultrasound are recommended for men ages 32 to 67 years who are current or former smokers.  Healthy men should no longer receive prostate-specific antigen (PSA) blood tests as part of routine cancer screening. Talk with your health care provider about prostate cancer screening.  Testicular cancer screening is not recommended for adult males who have no symptoms. Screening includes self-exam, a health care provider exam, and other screening tests. Consult with your health care provider about any symptoms  you have or any concerns you have about testicular cancer.  Use sunscreen. Apply sunscreen liberally and repeatedly throughout the day. You should seek shade when your shadow is shorter than you. Protect yourself by wearing long sleeves, pants, a wide-brimmed hat, and sunglasses year round, whenever you are outdoors.  Once a month, do a whole-body skin exam, using a mirror to look at the skin on your back. Tell your health care provider about new moles, moles that have irregular borders, moles that are larger than a pencil eraser, or moles that have changed in shape or color.  Stay current with required vaccines (immunizations).  Influenza vaccine. All adults should be immunized every year.  Tetanus, diphtheria, and acellular pertussis (Td, Tdap) vaccine. An adult who has not previously received Tdap or who does not know his vaccine status should receive 1 dose of Tdap. This initial dose should be followed by tetanus and diphtheria toxoids (Td) booster doses every 10 years. Adults with an unknown or incomplete history of completing a 3-dose immunization series with Td-containing vaccines should begin or complete a primary immunization series including a Tdap dose. Adults should receive a Td booster every 10 years.  Varicella vaccine. An adult without evidence of immunity to varicella should receive 2 doses or a second dose if he has previously received 1 dose.  Human papillomavirus (HPV) vaccine. Males aged 68-21 years who have not received the vaccine previously should receive the 3-dose series. Males aged 22-26 years may be immunized. Immunization is recommended through the age of 6 years for any male who has sex with males and did not get any or all doses earlier. Immunization is recommended for any person with an immunocompromised condition through the age of 49 years if he did not get any or all doses earlier. During the 3-dose series, the second dose should be obtained 4-8 weeks after the first  dose. The third dose should be obtained 24 weeks after the first dose and 16 weeks after the second dose.  Zoster vaccine. One dose is recommended for adults aged 50 years or older unless certain conditions are present.  Measles, mumps, and rubella (MMR) vaccine. Adults born before [birth year] generally are considered immune to measles and mumps. Adults born in [birth year] or later should have 1 or more doses of MMR vaccine unless there is a contraindication to the vaccine or there is laboratory evidence of immunity to each of the three diseases. A routine second dose of MMR vaccine should be obtained at least 28 days after the first dose for students attending postsecondary  schools, health care workers, or international travelers. People who received inactivated measles vaccine or an unknown type of measles vaccine during 1963-1967 should receive 2 doses of MMR vaccine. People who received inactivated mumps vaccine or an unknown type of mumps vaccine before 1979 and are at high risk for mumps infection should consider immunization with 2 doses of MMR vaccine. Unvaccinated health care workers born before 1957 who lack laboratory evidence of measles, mumps, or rubella immunity or laboratory confirmation of disease should consider measles and mumps immunization with 2 doses of MMR vaccine or rubella immunization with 1 dose of MMR vaccine.  Pneumococcal 13-valent conjugate (PCV13) vaccine. When indicated, a person who is uncertain of his immunization history and has no record of immunization should receive the PCV13 vaccine. An adult aged 19 years or older who has certain medical conditions and has not been previously immunized should receive 1 dose of PCV13 vaccine. This PCV13 should be followed with a dose of pneumococcal polysaccharide (PPSV23) vaccine. The PPSV23 vaccine dose should be obtained at least 8 weeks after the dose of PCV13 vaccine. An adult aged 19 years or older who has certain medical conditions and  previously received 1 or more doses of PPSV23 vaccine should receive 1 dose of PCV13. The PCV13 vaccine dose should be obtained 1 or more years after the last PPSV23 vaccine dose.  Pneumococcal polysaccharide (PPSV23) vaccine. When PCV13 is also indicated, PCV13 should be obtained first. All adults aged 65 years and older should be immunized. An adult younger than age 65 years who has certain medical conditions should be immunized. Any person who resides in a nursing home or long-term care facility should be immunized. An adult smoker should be immunized. People with an immunocompromised condition and certain other conditions should receive both PCV13 and PPSV23 vaccines. People with human immunodeficiency virus (HIV) infection should be immunized as soon as possible after diagnosis. Immunization during chemotherapy or radiation therapy should be avoided. Routine use of PPSV23 vaccine is not recommended for American Indians, [place] Natives, or people younger than 65 years unless there are medical conditions that require PPSV23 vaccine. When indicated, people who have unknown immunization and have no record of immunization should receive PPSV23 vaccine. One-time revaccination 5 years after the first dose of PPSV23 is recommended for people aged 19-64 years who have chronic kidney failure, nephrotic syndrome, asplenia, or immunocompromised conditions. People who received 1-2 doses of PPSV23 before age 65 years should receive another dose of PPSV23 vaccine at age 65 years or later if at least 5 years have passed since the previous dose. Doses of PPSV23 are not needed for people immunized with PPSV23 at or after age 65 years.  Meningococcal vaccine. Adults with asplenia or persistent complement component deficiencies should receive 2 doses of quadrivalent meningococcal conjugate (MenACWY-D) vaccine. The doses should be obtained at least 2 months apart. Microbiologists working with certain meningococcal bacteria,  military recruits, people at risk during an outbreak, and people who travel to or live in countries with a high rate of meningitis should be immunized. A first-year college student up through age 21 years who is living in a residence hall should receive a dose if he did not receive a dose on or after his 16th birthday. Adults who have certain high-risk conditions should receive one or more doses of vaccine.  Hepatitis A vaccine. Adults who wish to be protected from this disease, have certain high-risk conditions, work with hepatitis A-infected animals, work in hepatitis A research labs, or   travel to or work in countries with a high rate of hepatitis A should be immunized. Adults who were previously unvaccinated and who anticipate close contact with an international adoptee during the first 60 days after arrival in the [place] from a country with a high rate of hepatitis A should be immunized.  Hepatitis B vaccine. Adults should be immunized if they wish to be protected from this disease, have certain high-risk conditions, may be exposed to blood or other infectious body fluids, are household contacts or sex partners of hepatitis B positive people, are clients or workers in certain care facilities, or travel to or work in countries with a high rate of hepatitis B.  Haemophilus influenzae type b (Hib) vaccine. A previously unvaccinated person with asplenia or sickle cell disease or having a scheduled splenectomy should receive 1 dose of Hib vaccine. Regardless of previous immunization, a recipient of a hematopoietic stem cell transplant should receive a 3-dose series 6-12 months after his successful transplant. Hib vaccine is not recommended for adults with HIV infection. Preventive Service / Frequency Ages 52 to 17  Blood pressure check.** / Every 1 to 2 years.  Lipid and cholesterol check.** / Every 5 years beginning at age 69.  Hepatitis C blood test.** / For any individual with known risks for  hepatitis C.  Skin self-exam. / Monthly.  Influenza vaccine. / Every year.  Tetanus, diphtheria, and acellular pertussis (Tdap, Td) vaccine.** / Consult your health care provider. 1 dose of Td every 10 years.  Varicella vaccine.** / Consult your health care provider.  HPV vaccine. / 3 doses over 6 months, if 72 or younger.  Measles, mumps, rubella (MMR) vaccine.** / You need at least 1 dose of MMR if you were born in 1957 or later. You may also need a second dose.  Pneumococcal 13-valent conjugate (PCV13) vaccine.** / Consult your health care provider.  Pneumococcal polysaccharide (PPSV23) vaccine.** / 1 to 2 doses if you smoke cigarettes or if you have certain conditions.  Meningococcal vaccine.** / 1 dose if you are age 35 to 60 years and a Market researcher living in a residence hall, or have one of several medical conditions. You may also need additional booster doses.  Hepatitis A vaccine.** / Consult your health care provider.  Hepatitis B vaccine.** / Consult your health care provider.  Haemophilus influenzae type b (Hib) vaccine.** / Consult your health care provider. Ages 35 to 8  Blood pressure check.** / Every 1 to 2 years.  Lipid and cholesterol check.** / Every 5 years beginning at age 57.  Lung cancer screening. / Every year if you are aged 44-80 years and have a 30-pack-year history of smoking and currently smoke or have quit within the past 15 years. Yearly screening is stopped once you have quit smoking for at least 15 years or develop a health problem that would prevent you from having lung cancer treatment.  Fecal occult blood test (FOBT) of stool. / Every year beginning at age 55 and continuing until age 73. You may not have to do this test if you get a colonoscopy every 10 years.  Flexible sigmoidoscopy** or colonoscopy.** / Every 5 years for a flexible sigmoidoscopy or every 10 years for a colonoscopy beginning at age 28 and continuing until age  1.  Hepatitis C blood test.** / For all people born from [birth year] through 1965 and any individual with known risks for hepatitis C.  Skin self-exam. / Monthly.  Influenza vaccine. / Every  year.  Tetanus, diphtheria, and acellular pertussis (Tdap/Td) vaccine.** / Consult your health care provider. 1 dose of Td every 10 years.  Varicella vaccine.** / Consult your health care provider.  Zoster vaccine.** / 1 dose for adults aged 53 years or older.  Measles, mumps, rubella (MMR) vaccine.** / You need at least 1 dose of MMR if you were born in 1957 or later. You may also need a second dose.  Pneumococcal 13-valent conjugate (PCV13) vaccine.** / Consult your health care provider.  Pneumococcal polysaccharide (PPSV23) vaccine.** / 1 to 2 doses if you smoke cigarettes or if you have certain conditions.  Meningococcal vaccine.** / Consult your health care provider.  Hepatitis A vaccine.** / Consult your health care provider.  Hepatitis B vaccine.** / Consult your health care provider.  Haemophilus influenzae type b (Hib) vaccine.** / Consult your health care provider. Ages 77 and over  Blood pressure check.** / Every 1 to 2 years.  Lipid and cholesterol check.**/ Every 5 years beginning at age 85.  Lung cancer screening. / Every year if you are aged 55-80 years and have a 30-pack-year history of smoking and currently smoke or have quit within the past 15 years. Yearly screening is stopped once you have quit smoking for at least 15 years or develop a health problem that would prevent you from having lung cancer treatment.  Fecal occult blood test (FOBT) of stool. / Every year beginning at age 33 and continuing until age 11. You may not have to do this test if you get a colonoscopy every 10 years.  Flexible sigmoidoscopy** or colonoscopy.** / Every 5 years for a flexible sigmoidoscopy or every 10 years for a colonoscopy beginning at age 28 and continuing until age 73.  Hepatitis C blood  test.** / For all people born from [birth year] through 1965 and any individual with known risks for hepatitis C.  Abdominal aortic aneurysm (AAA) screening.** / A one-time screening for ages 50 to 27 years who are current or former smokers.  Skin self-exam. / Monthly.  Influenza vaccine. / Every year.  Tetanus, diphtheria, and acellular pertussis (Tdap/Td) vaccine.** / 1 dose of Td every 10 years.  Varicella vaccine.** / Consult your health care provider.  Zoster vaccine.** / 1 dose for adults aged 34 years or older.  Pneumococcal 13-valent conjugate (PCV13) vaccine.** / Consult your health care provider.  Pneumococcal polysaccharide (PPSV23) vaccine.** / 1 dose for all adults aged 63 years and older.  Meningococcal vaccine.** / Consult your health care provider.  Hepatitis A vaccine.** / Consult your health care provider.  Hepatitis B vaccine.** / Consult your health care provider.  Haemophilus influenzae type b (Hib) vaccine.** / Consult your health care provider. **Family history and personal history of risk and conditions may change your health care provider's recommendations. Document Released: 08/07/2001 Document Revised: 06/16/2013 Document Reviewed: 11/06/2010 [facility] Patient Information 2015 [place], [place]. This information is not intended to replace advice given to you by your health care provider. Make sure you discuss any questions you have with your health care provider.

## 2014-06-09 NOTE — Progress Notes (Signed)
Pre visit review using our clinic review tool, if applicable. No additional management support is needed unless otherwise documented below in the visit note. 

## 2014-06-09 NOTE — Progress Notes (Signed)
Subjective:    HPI  The patient is here for a wellness exam. The patient has been doing well overall without major physical or psychological issues going on lately.  The patient has been doing well overall without major physical or psychological issues going on lately. He has lost a little wt    SBP at home is 130-140  BP Readings from Last 3 Encounters:  06/09/14 130/90  11/02/13 154/92  06/08/13 140/70   Wt Readings from Last 3 Encounters:  06/09/14 214 lb (97.07 kg)  11/02/13 217 lb (98.431 kg)  10/19/13 217 lb 12.8 oz (98.793 kg)      Review of Systems  Constitutional: Positive for fatigue. Negative for fever, chills, appetite change and unexpected weight change.  HENT: Positive for congestion, rhinorrhea and sinus pressure. Negative for nosebleeds, sneezing, sore throat and trouble swallowing.   Eyes: Negative for itching and visual disturbance.  Respiratory: Positive for cough and wheezing. Negative for chest tightness and shortness of breath.   Cardiovascular: Negative for chest pain, palpitations and leg swelling.  Gastrointestinal: Negative for nausea, abdominal pain, diarrhea, blood in stool and abdominal distention.  Genitourinary: Negative for frequency and hematuria.  Musculoskeletal: Negative for back pain, joint swelling, gait problem and neck pain.  Skin: Negative for rash.  Neurological: Negative for dizziness, tremors, speech difficulty and weakness.  Psychiatric/Behavioral: Negative for suicidal ideas, sleep disturbance, dysphoric mood and agitation. The patient is not nervous/anxious.        Objective:   Physical Exam  Constitutional: He is oriented to person, place, and time. He appears well-developed. No distress.  NAD  HENT:  Mouth/Throat: Oropharynx is clear and moist.  Eyes: Conjunctivae are normal. Pupils are equal, round, and reactive to light.  Neck: Normal range of motion. No JVD present. No thyromegaly present.  Cardiovascular: Normal  rate, regular rhythm, normal heart sounds and intact distal pulses.  Exam reveals no gallop and no friction rub.   No murmur heard. Pulmonary/Chest: Effort normal and breath sounds normal. No respiratory distress. He has no wheezes. He has no rales. He exhibits no tenderness.  Abdominal: Soft. Bowel sounds are normal. He exhibits no distension and no mass. There is no tenderness. There is no rebound and no guarding.  Genitourinary: Guaiac positive stool.  Prostate 1+ smooth  Musculoskeletal: Normal range of motion. He exhibits no edema or tenderness.  Lymphadenopathy:    He has no cervical adenopathy.  Neurological: He is alert and oriented to person, place, and time. He has normal reflexes. No cranial nerve deficit. He exhibits normal muscle tone. He displays a negative Romberg sign. Coordination and gait normal.  Skin: Skin is warm and dry. No rash noted.  Psychiatric: He has a normal mood and affect. His behavior is normal. Judgment and thought content normal.    Lab Results  Component Value Date   WBC 7.7 06/08/2013   HGB 15.1 06/08/2013   HCT 44.0 06/08/2013   PLT 213.0 06/08/2013   GLUCOSE 125* 06/08/2013   CHOL 199 09/01/2012   TRIG 282.0* 09/01/2012   HDL 51.90 09/01/2012   LDLDIRECT 104.0 09/01/2012   ALT 60* 06/08/2013   AST 32 06/08/2013   NA 140 06/08/2013   K 3.9 06/08/2013   CL 105 06/08/2013   CREATININE 1.0 06/08/2013   BUN 17 06/08/2013   CO2 28 06/08/2013   TSH 0.51 06/08/2013   PSA 0.89 06/08/2013   INR 1.0 09/01/2012   HGBA1C 5.4 06/08/2013  Assessment & Plan:

## 2014-06-09 NOTE — Assessment & Plan Note (Signed)
Labs

## 2014-06-09 NOTE — Assessment & Plan Note (Signed)
Labs  Continue with current prescription therapy as reflected on the Med list.  

## 2014-06-09 NOTE — Assessment & Plan Note (Signed)
Continue with current prescription therapy as reflected on the Med list. Labs  

## 2014-06-10 ENCOUNTER — Telehealth: Payer: Self-pay | Admitting: Internal Medicine

## 2014-06-10 LAB — LDL CHOLESTEROL, DIRECT: Direct LDL: 119.4 mg/dL

## 2014-06-10 MED ORDER — AZITHROMYCIN 250 MG PO TABS: ORAL_TABLET | ORAL | Status: DC

## 2014-06-10 NOTE — Telephone Encounter (Signed)
Notified [name] md [facility].../lmb

## 2014-06-10 NOTE — Telephone Encounter (Signed)
Patient seen Dr. Camila Li yesterday.  Patient has decided that he would like an antibiotic.  [facility] [address] in [place].

## 2014-06-10 NOTE — Telephone Encounter (Signed)
Ok Zpac - done [employer]

## 2014-06-14 ENCOUNTER — Encounter: Payer: Self-pay | Admitting: Internal Medicine

## 2014-06-21 ENCOUNTER — Telehealth: Payer: Self-pay | Admitting: Internal Medicine

## 2014-06-21 MED ORDER — TAMSULOSIN HCL 0.4 MG PO CAPS: 0.4000 mg | ORAL_CAPSULE | Freq: Every day | ORAL | Status: DC

## 2014-06-21 NOTE — Telephone Encounter (Signed)
There is no exact generic equivalent of Rapaflo. We can try Flomax: I'll email Thx

## 2014-06-21 NOTE — Telephone Encounter (Signed)
copay for rapaflo is too high.  Patient is requesting something else instead.

## 2014-06-22 NOTE — Telephone Encounter (Signed)
Called pt no answer LMOM (c) with md response.../lmb 

## 2014-07-28 ENCOUNTER — Other Ambulatory Visit: Payer: Self-pay | Admitting: Internal Medicine

## 2014-08-23 DIAGNOSIS — L820 Inflamed seborrheic keratosis: Secondary | ICD-10-CM | POA: Diagnosis not present

## 2014-08-23 DIAGNOSIS — L579 Skin changes due to chronic exposure to nonionizing radiation, unspecified: Secondary | ICD-10-CM | POA: Diagnosis not present

## 2014-08-23 DIAGNOSIS — L814 Other melanin hyperpigmentation: Secondary | ICD-10-CM | POA: Diagnosis not present

## 2014-08-23 DIAGNOSIS — L821 Other seborrheic keratosis: Secondary | ICD-10-CM | POA: Diagnosis not present

## 2014-08-23 DIAGNOSIS — D225 Melanocytic nevi of trunk: Secondary | ICD-10-CM | POA: Diagnosis not present

## 2014-08-23 DIAGNOSIS — C44519 Basal cell carcinoma of skin of other part of trunk: Secondary | ICD-10-CM | POA: Diagnosis not present

## 2014-08-23 DIAGNOSIS — D492 Neoplasm of unspecified behavior of bone, soft tissue, and skin: Secondary | ICD-10-CM | POA: Diagnosis not present

## 2014-08-31 ENCOUNTER — Telehealth: Payer: Self-pay | Admitting: Internal Medicine

## 2014-08-31 NOTE — Telephone Encounter (Deleted)
Pt called

## 2014-09-01 ENCOUNTER — Ambulatory Visit: Payer: BLUE CROSS/BLUE SHIELD | Admitting: Internal Medicine

## 2014-09-02 ENCOUNTER — Ambulatory Visit (INDEPENDENT_AMBULATORY_CARE_PROVIDER_SITE_OTHER): Payer: Medicare Other | Admitting: Internal Medicine

## 2014-09-02 ENCOUNTER — Telehealth: Payer: Self-pay | Admitting: *Deleted

## 2014-09-02 ENCOUNTER — Encounter: Payer: Self-pay | Admitting: Internal Medicine

## 2014-09-02 VITALS — BP 160/80 | HR 61 | Temp 98.3°F | Ht 70.0 in | Wt 223.0 lb

## 2014-09-02 DIAGNOSIS — M7661 Achilles tendinitis, right leg: Secondary | ICD-10-CM | POA: Diagnosis not present

## 2014-09-02 DIAGNOSIS — M1000 Idiopathic gout, unspecified site: Secondary | ICD-10-CM

## 2014-09-02 MED ORDER — INDOMETHACIN 50 MG PO CAPS: 50.0000 mg | ORAL_CAPSULE | Freq: Three times a day (TID) | ORAL | Status: AC | PRN

## 2014-09-02 NOTE — Progress Notes (Signed)
   Subjective:    HPI  C/o severe pain in the R ankle x2 wks. It has started on a small cruise boat. Rich food, red wine - ?gout. He was wearing boat shoes. No recall of injury. Pt had Indocin tabs 50 mg tid - he started to take it - better. Pt came back home yesterday.  SBP at home is 130-140  BP Readings from Last 3 Encounters:  09/02/14 160/80  06/09/14 130/90  11/02/13 154/92   Wt Readings from Last 3 Encounters:  09/02/14 223 lb (101.152 kg)  06/09/14 214 lb (97.07 kg)  11/02/13 217 lb (98.431 kg)      Review of Systems  Constitutional: Negative for fever, chills and fatigue.  HENT: Negative for congestion, nosebleeds, rhinorrhea and sinus pressure.   Respiratory: Negative for cough, chest tightness, shortness of breath and wheezing.   Gastrointestinal: Negative for abdominal pain.  Musculoskeletal: Positive for arthralgias and gait problem. Negative for joint swelling.       Objective:   Physical Exam  Constitutional: He is oriented to person, place, and time. He appears well-developed.  HENT:  Mouth/Throat: Oropharynx is clear and moist.  Eyes: Conjunctivae are normal. Pupils are equal, round, and reactive to light.  Neck: Normal range of motion. No JVD present. No thyromegaly present.  Cardiovascular: Normal rate, regular rhythm, normal heart sounds and intact distal pulses.  Exam reveals no gallop and no friction rub.   No murmur heard. Pulmonary/Chest: Effort normal and breath sounds normal. No respiratory distress. He has no wheezes. He has no rales. He exhibits no tenderness.  Abdominal: Soft. Bowel sounds are normal. He exhibits no distension and no mass. There is no tenderness. There is no rebound and no guarding.  Musculoskeletal: Normal range of motion. He exhibits tenderness. He exhibits no edema.  Lymphadenopathy:    He has no cervical adenopathy.  Neurological: He is alert and oriented to person, place, and time. He has normal reflexes. No cranial  nerve deficit. He exhibits normal muscle tone. Coordination normal.  Skin: Skin is warm and dry. No rash noted.  Psychiatric: He has a normal mood and affect. His behavior is normal. Judgment and thought content normal.  B ankles with trace edema R achilles tendon is tender R ankle is NT  Lab Results  Component Value Date   WBC 8.2 06/09/2014   HGB 16.0 06/09/2014   HCT 47.8 06/09/2014   PLT 210.0 06/09/2014   GLUCOSE 94 06/09/2014   CHOL 215* 06/09/2014   TRIG 244.0* 06/09/2014   HDL 52.80 06/09/2014   LDLDIRECT 119.4 06/09/2014   ALT 77* 06/09/2014   AST 43* 06/09/2014   NA 141 06/09/2014   K 4.2 06/09/2014   CL 103 06/09/2014   CREATININE 1.0 06/09/2014   BUN 21 06/09/2014   CO2 26 06/09/2014   TSH 0.56 06/09/2014   PSA 0.82 06/09/2014   INR 1.0 09/01/2012   HGBA1C 5.4 06/08/2013         Assessment & Plan:

## 2014-09-02 NOTE — Assessment & Plan Note (Signed)
On Uloric Doubt gout now

## 2014-09-02 NOTE — Telephone Encounter (Signed)
Per Dr. Alain Marion: I left Rise Paganini a detailed message informing her patient's upper and lower endoscopies are due in 2018.

## 2014-09-02 NOTE — Progress Notes (Signed)
Pre visit review using our clinic review tool, if applicable. No additional management support is needed unless otherwise documented below in the visit note. 

## 2014-09-02 NOTE — Assessment & Plan Note (Addendum)
Sports Med ref  Use Pennsaid 1 pack 3 times a day Get a heel lift  - both shoes Ice Rest Elevate legs

## 2014-09-02 NOTE — Patient Instructions (Addendum)
Use Pennsaid 1 pack 3 times a day Get a heel lift  - both shoes Ice Rest Elevate leg

## 2014-09-07 ENCOUNTER — Ambulatory Visit: Payer: Medicare Other

## 2014-09-07 ENCOUNTER — Ambulatory Visit (INDEPENDENT_AMBULATORY_CARE_PROVIDER_SITE_OTHER): Payer: Medicare Other | Admitting: Family Medicine

## 2014-09-07 ENCOUNTER — Encounter: Payer: Self-pay | Admitting: Family Medicine

## 2014-09-07 ENCOUNTER — Other Ambulatory Visit (INDEPENDENT_AMBULATORY_CARE_PROVIDER_SITE_OTHER): Payer: Medicare Other

## 2014-09-07 VITALS — BP 142/82 | HR 76 | Ht 70.0 in | Wt 223.0 lb

## 2014-09-07 DIAGNOSIS — M25571 Pain in right ankle and joints of right foot: Secondary | ICD-10-CM

## 2014-09-07 DIAGNOSIS — S86111A Strain of other muscle(s) and tendon(s) of posterior muscle group at lower leg level, right leg, initial encounter: Secondary | ICD-10-CM | POA: Diagnosis not present

## 2014-09-07 DIAGNOSIS — Z23 Encounter for immunization: Secondary | ICD-10-CM | POA: Diagnosis not present

## 2014-09-07 DIAGNOSIS — S86119A Strain of other muscle(s) and tendon(s) of posterior muscle group at lower leg level, unspecified leg, initial encounter: Secondary | ICD-10-CM | POA: Insufficient documentation

## 2014-09-07 NOTE — Progress Notes (Signed)
Pre visit review using our clinic review tool, if applicable. No additional management support is needed unless otherwise documented below in the visit note. 

## 2014-09-07 NOTE — Assessment & Plan Note (Signed)
Patient has more of a gastrocnemius tear than truly an Achilles tear. This is actually good news. We discussed icing regimen. We discussed home exercises. Patient was put in a compression sleeve home exercises from athletic trainer. Patient will do and icing protocol and will come back and see me again in 3 weeks. Continuing heel lift.

## 2014-09-07 NOTE — Patient Instructions (Addendum)
Good to see you.   Ice 10 minutes 2 times daily. Usually after activity and before bed. Exercises 3 times a week.  Vitamin D 4000 IU daily for 2 weeks then back to 2000 IU daily Consider a heel lift in the shoe to see if it helps Compression sleeve  Consider iron 325mg  3 times a week See me again in 3 weeks.

## 2014-09-07 NOTE — Progress Notes (Signed)
[facility] [place], [place] [address] Phone: [phone] Subjective:    I'm seeing this patient by the request  of:  [name], MD   CC: Right ankle pain  POE:UMPNTIRWER Dominyck CHRISTPOHER SIEVERS is a 80 y.o. male coming in with complaint of right ankle pain. Patient has had this pain for multiple weeks at this time. Patient does not remember any significant injury but states that 3 weeks ago patient was jumping from a pontoon boat onto another boat and had pain that felt like he was shot. Patient states he did have some swelling at that time. Since then it seemed to get better for a while but now for the last 3 weeks it does not seem to be getting better. States that is very sore especially with walking. Patient was seen by primary care provider who was concern for an Achilles tendinosis. Patient has been wearing a heel lift which has been somewhat beneficial. Patient denies any significant erythema. Patient states it was significant he wears for some time and has improved. Rates the severity of pain a 7 out of 10.    Past medical history, social, surgical and family history all reviewed in electronic medical record.   Review of Systems: No headache, visual changes, nausea, vomiting, diarrhea, constipation, dizziness, abdominal pain, skin rash, fevers, chills, night sweats, weight loss, swollen lymph nodes, body aches, joint swelling, muscle aches, chest pain, shortness of breath, mood changes.   Objective Blood pressure 142/82, pulse 76, height 5\' 10"  (1.778 m), weight 223 lb (101.152 kg), SpO2 96 %.  General: No apparent distress alert and oriented x3 mood and affect normal, dressed appropriately.  HEENT: Pupils equal, extraocular movements intact  Respiratory: Patient's speak in full sentences and does not appear short of breath  Cardiovascular: No lower extremity edema, non tender, no erythema  Skin: Warm dry intact with no signs of infection  or rash on extremities or on axial skeleton.  Abdomen: Soft nontender  Neuro: Cranial nerves II through XII are intact, neurovascularly intact in all extremities with 2+ DTRs and 2+ pulses.  Lymph: No lymphadenopathy of posterior or anterior cervical chain or axillae bilaterally.  Gait normal with good balance and coordination.  MSK:  Non tender with full range of motion and good stability and symmetric strength and tone of shoulders, elbows, wrist, hip, knee and bilaterally.  Ankle: Right Trace swelling compared to the contralateral side of the ankle Range of motion is full in all directions. Strength is 5/5 in all directions. Stable lateral and medial ligaments; squeeze test and kleiger test unremarkable; Talar dome nontender; No pain at base of 5th MT; No tenderness over cuboid; No tenderness over N spot or navicular prominence No tenderness on posterior aspects of lateral and medial malleolus No sign of peroneal tendon subluxations or tenderness to palpation Negative tarsal tunnel tinel's Able to walk 4 steps. Patient does have some tightness of the Achilles tendon noted. Appears to be intact with a negative Thompson. Contralateral ankle unremarkable  Foot exam shows  MSK US performed of: Right ankle This study was ordered, performed, and interpreted by Charlann Boxer D.O.  Foot/Ankle:   All structures visualized.   Talar dome unremarkable  Ankle mortise without effusion. Peroneus longus and brevis tendons unremarkable on long and transverse views without sheath effusions. Posterior tibialis, flexor hallucis longus, and flexor digitorum longus tendons unremarkable on long and transverse views without sheath effusions. Degenerative changes noted Achilles tendon visualized  along length of tendon and unremarkable on long and transverse views without sheath effusion. Patient though has a underlying soleus tear noted. Patient also has a tear of the distal aspect of the medial gastrocnemius  head at the insertion on the Achilles. Anterior Talofibular Ligament and Calcaneofibular Ligaments unremarkable and intact. Deltoid Ligament unremarkable and intact. Plantar fascia intact and without effusion, normal thickness. No increased doppler signal, cap sign, or thickening of tibial cortex. Power doppler signal normal.  IMPRESSION:  Medial gastrocnemius tear with soleus tear. Patient's Achilles appears to be intact.   Procedure note [id number]; 15 minutes spent for Therapeutic exercises as stated in above notes.  This included exercises focusing on stretching, strengthening, with significant focus on eccentric aspects.   Proper technique shown and discussed handout in great detail with ATC.  All questions were discussed and answered.     Impression and Recommendations:     This case required medical decision making of moderate complexity.

## 2014-09-13 DIAGNOSIS — C44519 Basal cell carcinoma of skin of other part of trunk: Secondary | ICD-10-CM | POA: Diagnosis not present

## 2014-09-28 ENCOUNTER — Ambulatory Visit (INDEPENDENT_AMBULATORY_CARE_PROVIDER_SITE_OTHER): Payer: Medicare Other | Admitting: Family Medicine

## 2014-09-28 ENCOUNTER — Other Ambulatory Visit (INDEPENDENT_AMBULATORY_CARE_PROVIDER_SITE_OTHER): Payer: Medicare Other

## 2014-09-28 ENCOUNTER — Encounter: Payer: Self-pay | Admitting: Family Medicine

## 2014-09-28 VITALS — BP 130/82 | HR 71 | Ht 70.0 in | Wt 223.0 lb

## 2014-09-28 DIAGNOSIS — S86811A Strain of other muscle(s) and tendon(s) at lower leg level, right leg, initial encounter: Secondary | ICD-10-CM

## 2014-09-28 DIAGNOSIS — S86111A Strain of other muscle(s) and tendon(s) of posterior muscle group at lower leg level, right leg, initial encounter: Secondary | ICD-10-CM

## 2014-09-28 NOTE — Assessment & Plan Note (Signed)
Patient is healing well even though he is a non-compliant overall. Discussed with patient and wearing good shoes as well as continuing the compression will be helpful as well as doing the exercises at least occasionally. Patient though is not having any pain that is affecting his daily activities and has even played golf already. Patient will come back again in 6 weeks if patient has any residual pain. I would ultrasound again. Discussed with patient he has not out of the woods for the Achilles at this time but patient does not want a slow down.

## 2014-09-28 NOTE — Progress Notes (Signed)
[facility] [place], [place] [address] Phone: [phone] Subjective:     CC: Right ankle pain follow-up  BJY:[id number] Todd [place] is a 80 y.o. male coming in with complaint of right ankle pain. Patient was seen previously and had more of a gastrocnemius tear. Patient states that it is doing significant better. Patient had not been doing the exercises. Patient had been wearing the compression sometimes. Patient has not been icing. Overall though states that he is about 70-80% better.  Past medical history, social, surgical and family history all reviewed in electronic medical record.   Review of Systems: No headache, visual changes, nausea, vomiting, diarrhea, constipation, dizziness, abdominal pain, skin rash, fevers, chills, night sweats, weight loss, swollen lymph nodes, body aches, joint swelling, muscle aches, chest pain, shortness of breath, mood changes.   Objective Blood pressure 130/82, pulse 71, height 5\' 10"  (1.778 m), weight 223 lb (101.152 kg), SpO2 97 %.  General: No apparent distress alert and oriented x3 mood and affect normal, dressed appropriately.  HEENT: Pupils equal, extraocular movements intact  Respiratory: Patient's speak in full sentences and does not appear short of breath  Cardiovascular: No lower extremity edema, non tender, no erythema  Skin: Warm dry intact with no signs of infection or rash on extremities or on axial skeleton.  Abdomen: Soft nontender  Neuro: Cranial nerves II through XII are intact, neurovascularly intact in all extremities with 2+ DTRs and 2+ pulses.  Lymph: No lymphadenopathy of posterior or anterior cervical chain or axillae bilaterally.  Gait normal with good balance and coordination.  MSK:  Non tender with full range of motion and good stability and symmetric strength and tone of shoulders, elbows, wrist, hip, knee and bilaterally.  Ankle: Right Trace swelling compared to the  contralateral side of the ankle Range of motion is full in all directions. Strength is 5/5 in all directions. Stable lateral and medial ligaments; squeeze test and kleiger test unremarkable; Talar dome nontender; No pain at base of 5th MT; No tenderness over cuboid; No tenderness over N spot or navicular prominence No tenderness on posterior aspects of lateral and medial malleolus No sign of peroneal tendon subluxations or tenderness to palpation Negative tarsal tunnel tinel's Able to walk 4 steps. Patient does have some tightness of the Achilles tendon noted. Appears to be intact with a negative Thompson. Contralateral ankle unremarkable   MSK US performed of: Right ankle This study was ordered, performed, and interpreted by Charlann Boxer D.O.  Foot/Ankle:   All structures visualized.   Talar dome unremarkable  Ankle mortise without effusion. Peroneus longus and brevis tendons unremarkable on long and transverse views without sheath effusions. Posterior tibialis, flexor hallucis longus, and flexor digitorum longus tendons unremarkable on long and transverse views without sheath effusions. Degenerative changes noted Achilles tendon visualized along length of tendon and unremarkable on long and transverse views with minimal effusion. Patient though has a underlying tear that was noted previously of the gastrocnemius and soleus is been healing well. Completely filled in with scar tissue. Increasing Doppler flow.. Patient also has a tear of the distal aspect of the medial gastrocnemius head at the insertion on the Achilles. Anterior Talofibular Ligament and Calcaneofibular Ligaments unremarkable and intact. Deltoid Ligament unremarkable and intact. Plantar fascia intact and without effusion, normal thickness. No increased doppler signal, cap sign, or thickening of tibial cortex. Power doppler signal normal.  IMPRESSION:  Medial gastrocnemius tear with soleus tear. Patient's Achilles  is intact  with some mild hypoechoic changes.    Impression and Recommendations:     This case required medical decision making of moderate complexity.

## 2014-09-28 NOTE — Progress Notes (Signed)
Pre visit review using our clinic review tool, if applicable. No additional management support is needed unless otherwise documented below in the visit note. 

## 2014-09-28 NOTE — Patient Instructions (Signed)
Good to see you Ice calf after activity Down to 2000IU daily of vitamin D forever.  Try the iron 3 times a week always add a stool softener Keep hydrated.  Walking is ok but wear the compression.  See me again in 4 weeks if not perfect.

## 2014-11-15 DIAGNOSIS — L814 Other melanin hyperpigmentation: Secondary | ICD-10-CM | POA: Diagnosis not present

## 2014-11-15 DIAGNOSIS — L579 Skin changes due to chronic exposure to nonionizing radiation, unspecified: Secondary | ICD-10-CM | POA: Diagnosis not present

## 2014-11-15 DIAGNOSIS — Z85828 Personal history of other malignant neoplasm of skin: Secondary | ICD-10-CM | POA: Diagnosis not present

## 2014-11-15 DIAGNOSIS — L821 Other seborrheic keratosis: Secondary | ICD-10-CM | POA: Diagnosis not present

## 2014-11-29 ENCOUNTER — Telehealth: Payer: Self-pay | Admitting: Internal Medicine

## 2014-11-29 NOTE — Telephone Encounter (Signed)
amLODipine (NORVASC) 5 MG tablet [[id number]] DISCONTINUED     WELCHOL 625 MG tablet [[id number]] DISCONTINUED  [facility] that [facility], but the mychart med list does show them active. Patient is confused about whether these should be filled or not? Were they DC'ed in error? Please call beverly to advise

## 2014-11-30 ENCOUNTER — Encounter: Payer: Self-pay | Admitting: Internal Medicine

## 2014-11-30 ENCOUNTER — Other Ambulatory Visit: Payer: Self-pay | Admitting: Internal Medicine

## 2014-11-30 MED ORDER — AMLODIPINE BESYLATE 5 MG PO TABS: 5.0000 mg | ORAL_TABLET | Freq: Every day | ORAL | Status: DC

## 2014-11-30 MED ORDER — VITAMIN D 1000 UNITS PO TABS: 2000.0000 [IU] | ORAL_TABLET | Freq: Every day | ORAL | Status: AC

## 2014-11-30 NOTE — Telephone Encounter (Signed)
I called Todd Wolf- I tried to go through all meds that pt should be taking. I advised there is no record of Amlodipine or Welchol being discontinued. I tried to answer all her questions. It is unclear what he is taking and what he needs to be taking.  Does he need to be taking Rapaflo, Flomax and Avodart? Also, [facility] or d[facility]   .I advised OV with PCP for med check/clarification. [name] requests to send message to MD.  Please give me the entire list of meds pt needs to be taking.

## 2014-11-30 NOTE — Telephone Encounter (Signed)
Colesevelam 625 mg (Welchol) - a.m. and p.m.     Febuxostat 40 mg (Uloric) - a.m.    Cholecalciferol (Vitamin D) - a.m.    Benazepril HCL40 mg - a.m.    Amlodipine Besylate 5 mg- p.m.    Tamsulosin (Flomax) 0.4 mg - p.m.    Dutasteride (Avodart) 0.5 mg- p.m.    Esomeprazole (Nexium) 40 mg. - p.m.    Asperin (325 mg) - p.m.        I've emailed the reply. The above list is correct Thx

## 2014-11-30 NOTE — Telephone Encounter (Signed)
Rise Paganini has called you back and she said she was going to leave a message on MyChart for you. Her number is [phone]

## 2014-11-30 NOTE — Telephone Encounter (Signed)
Has Mr Morelos been taking Amlodipine and Welchol? Thx

## 2014-12-01 NOTE — Telephone Encounter (Signed)
Left detailed mess informing Rise Paganini of below.

## 2014-12-02 ENCOUNTER — Other Ambulatory Visit: Payer: Self-pay | Admitting: Internal Medicine

## 2015-02-10 ENCOUNTER — Encounter: Payer: Self-pay | Admitting: Gastroenterology

## 2015-02-21 DIAGNOSIS — D1801 Hemangioma of skin and subcutaneous tissue: Secondary | ICD-10-CM | POA: Diagnosis not present

## 2015-02-21 DIAGNOSIS — L814 Other melanin hyperpigmentation: Secondary | ICD-10-CM | POA: Diagnosis not present

## 2015-02-21 DIAGNOSIS — D225 Melanocytic nevi of trunk: Secondary | ICD-10-CM | POA: Diagnosis not present

## 2015-02-21 DIAGNOSIS — Z85828 Personal history of other malignant neoplasm of skin: Secondary | ICD-10-CM | POA: Diagnosis not present

## 2015-02-21 DIAGNOSIS — L579 Skin changes due to chronic exposure to nonionizing radiation, unspecified: Secondary | ICD-10-CM | POA: Diagnosis not present

## 2015-02-21 DIAGNOSIS — L821 Other seborrheic keratosis: Secondary | ICD-10-CM | POA: Diagnosis not present

## 2015-04-25 ENCOUNTER — Ambulatory Visit (INDEPENDENT_AMBULATORY_CARE_PROVIDER_SITE_OTHER): Payer: Medicare Other | Admitting: Internal Medicine

## 2015-04-25 ENCOUNTER — Encounter: Payer: Self-pay | Admitting: Internal Medicine

## 2015-04-25 VITALS — BP 130/80 | HR 71 | Temp 98.5°F | Wt 218.0 lb

## 2015-04-25 DIAGNOSIS — L989 Disorder of the skin and subcutaneous tissue, unspecified: Secondary | ICD-10-CM | POA: Diagnosis not present

## 2015-04-25 DIAGNOSIS — I10 Essential (primary) hypertension: Secondary | ICD-10-CM | POA: Diagnosis not present

## 2015-04-25 MED ORDER — TRIAMCINOLONE ACETONIDE 0.5 % EX OINT: 1.0000 "application " | TOPICAL_OINTMENT | Freq: Two times a day (BID) | CUTANEOUS | Status: DC

## 2015-04-25 NOTE — Progress Notes (Signed)
Subjective:  Patient ID: Todd Wolf, male    DOB: 01-07-1935  Age: 80 y.o. MRN: [id number]  CC: No chief complaint on file.   HPI Todd Wolf presents for R lower flank remote uncomplicated tick bite (6 mo ago). 2 wks ago pt had ?cellulitis in this area resolved after 1 d Rx w/Augmentin. Feeling well...  Outpatient Prescriptions Prior to Visit  Medication Sig Dispense Refill  . amLODipine (NORVASC) 5 MG tablet Take 1 tablet (5 mg total) by mouth daily. 90 tablet 3  . benazepril (LOTENSIN) 40 MG tablet TAKE 1 TABLET BY MOUTH EVERY DAY 30 tablet 5  . cholecalciferol (VITAMIN D) 1000 UNITS tablet Take 2 tablets (2,000 Units total) by mouth daily. 100 tablet 3  . dutasteride (AVODART) 0.5 MG capsule Take 1 capsule (0.5 mg total) by mouth daily. 90 capsule 3  . esomeprazole (NEXIUM) 40 MG capsule Take 1 capsule (40 mg total) by mouth daily. 90 capsule 3  . febuxostat (ULORIC) 40 MG tablet Take 1 tablet (40 mg total) by mouth daily. 90 tablet 3  . scopolamine (TRANSDERM-SCOP) 1 MG/3DAYS Place 1 patch (1.5 mg total) onto the skin every 3 (three) days. 4 patch 1  . tamsulosin (FLOMAX) 0.4 MG CAPS capsule Take 1 capsule (0.4 mg total) by mouth daily after supper. 90 capsule 3  . WELCHOL 625 MG tablet TAKE 1 TABLET (625 MG TOTAL) BY MOUTH 2 (TWO) TIMES DAILY WITH A MEAL. 60 tablet 11   No facility-administered medications prior to visit.    ROS Review of Systems  Constitutional: Negative for appetite change, fatigue and unexpected weight change.  HENT: Negative for congestion, nosebleeds, sneezing, sore throat and trouble swallowing.   Eyes: Negative for itching and visual disturbance.  Respiratory: Negative for cough.   Cardiovascular: Negative for chest pain, palpitations and leg swelling.  Gastrointestinal: Negative for nausea, diarrhea, blood in stool and abdominal distention.  Genitourinary: Negative for frequency and hematuria.  Musculoskeletal: Negative for back pain,  joint swelling, gait problem and neck pain.  Skin: Negative for rash.  Neurological: Negative for dizziness, tremors, speech difficulty and weakness.  Psychiatric/Behavioral: Negative for sleep disturbance, dysphoric mood and agitation. The patient is not nervous/anxious.     Objective:  BP 130/80 mmHg  Pulse 71  Temp(Src) 98.5 F (36.9 C) (Oral)  Wt 218 lb (98.884 kg)  SpO2 96%  BP Readings from Last 3 Encounters:  04/25/15 130/80  09/28/14 130/82  09/07/14 142/82    Wt Readings from Last 3 Encounters:  04/25/15 218 lb (98.884 kg)  09/28/14 223 lb (101.152 kg)  09/07/14 223 lb (101.152 kg)    Physical Exam  Constitutional: He is oriented to person, place, and time. He appears well-developed. No distress.  NAD  HENT:  Mouth/Throat: Oropharynx is clear and moist.  Eyes: Conjunctivae are normal. Pupils are equal, round, and reactive to light.  Neck: Normal range of motion. No JVD present. No thyromegaly present.  Cardiovascular: Normal rate, regular rhythm, normal heart sounds and intact distal pulses.  Exam reveals no gallop and no friction rub.   No murmur heard. Pulmonary/Chest: Effort normal and breath sounds normal. No respiratory distress. He has no wheezes. He has no rales. He exhibits no tenderness.  Abdominal: Soft. Bowel sounds are normal. He exhibits no distension and no mass. There is no tenderness. There is no rebound and no guarding.  Musculoskeletal: Normal range of motion. He exhibits no edema or tenderness.  Lymphadenopathy:  He has no cervical adenopathy.  Neurological: He is alert and oriented to person, place, and time. He has normal reflexes. No cranial nerve deficit. He exhibits normal muscle tone. He displays a negative Romberg sign. Coordination and gait normal.  Skin: Skin is warm and dry. No rash noted.  Psychiatric: He has a normal mood and affect. His behavior is normal. Judgment and thought content normal.  nl round scar R flank  Lab Results    Component Value Date   WBC 8.2 06/09/2014   HGB 16.0 06/09/2014   HCT 47.8 06/09/2014   PLT 210.0 06/09/2014   GLUCOSE 94 06/09/2014   CHOL 215* 06/09/2014   TRIG 244.0* 06/09/2014   HDL 52.80 06/09/2014   LDLDIRECT 119.4 06/09/2014   ALT 77* 06/09/2014   AST 43* 06/09/2014   NA 141 06/09/2014   K 4.2 06/09/2014   CL 103 06/09/2014   CREATININE 1.0 06/09/2014   BUN 21 06/09/2014   CO2 26 06/09/2014   TSH 0.56 06/09/2014   PSA 0.82 06/09/2014   INR 1.0 09/01/2012   HGBA1C 5.4 06/08/2013    Dg Chest 2 View  12/04/2011  *RADIOLOGY REPORT* Clinical Data: History of dyspnea and granuloma.  History of hypertension. CHEST - 2 VIEW Comparison: 02/21/2009. Findings: Cardiac silhouette is upper limits of normal.  It appears stable.  Mediastinal and hilar contours appear stable.  Linear fibrotic change is seen in the left costophrenic angle region.  A small calcified granuloma is stable in the left lung base.  No active granulomatous process is identified. There is slight flattening of the diaphragm on lateral image which may reflect minimal hyperinflation.  No skeletal lesions are identified. IMPRESSION: Stable appearance of calcified lingular granuloma.  No active granulomatous process is identified. No pulmonary edema or pneumonia.  Slight flattening of diaphragm on lateral image may reflect minimal hyperinflation. Original Report Authenticated By: Delane Ginger, M.D.   Assessment & Plan:   Diagnoses and all orders for this visit:  Skin lesion of back  Other orders -     triamcinolone ointment (KENALOG) 0.5 %; Apply 1 application topically 2 (two) times daily.  I am having Mr. Sachse start on triamcinolone ointment. I am also having him maintain his dutasteride, esomeprazole, febuxostat, scopolamine, tamsulosin, benazepril, amLODipine, cholecalciferol, and WELCHOL.  Meds ordered this encounter  Medications  . triamcinolone ointment (KENALOG) 0.5 %    Sig: Apply 1 application  topically 2 (two) times daily.    Dispense:  30 g    Refill:  0     Follow-up: Return for a follow-up visit.  [name], MD

## 2015-04-25 NOTE — Progress Notes (Signed)
Pre visit review using our clinic review tool, if applicable. No additional management support is needed unless otherwise documented below in the visit note. 

## 2015-04-26 ENCOUNTER — Ambulatory Visit: Payer: Medicare Other | Admitting: Internal Medicine

## 2015-04-26 DIAGNOSIS — H2513 Age-related nuclear cataract, bilateral: Secondary | ICD-10-CM | POA: Diagnosis not present

## 2015-04-27 ENCOUNTER — Encounter: Payer: Self-pay | Admitting: Internal Medicine

## 2015-04-27 DIAGNOSIS — L989 Disorder of the skin and subcutaneous tissue, unspecified: Secondary | ICD-10-CM | POA: Insufficient documentation

## 2015-04-27 NOTE — Assessment & Plan Note (Signed)
11/16 R lower flank Remote tic bite (6 mo ago) uncomplicated. 2 wks ago pt had ?cellulitis in this area resolved after 1 d Rx w/Augmentin RTC if issues

## 2015-04-27 NOTE — Assessment & Plan Note (Signed)
Benazepril, amlodipine Rx

## 2015-05-29 ENCOUNTER — Other Ambulatory Visit: Payer: Self-pay | Admitting: Internal Medicine

## 2015-06-14 ENCOUNTER — Ambulatory Visit (INDEPENDENT_AMBULATORY_CARE_PROVIDER_SITE_OTHER): Payer: Medicare Other | Admitting: Internal Medicine

## 2015-06-14 ENCOUNTER — Ambulatory Visit (INDEPENDENT_AMBULATORY_CARE_PROVIDER_SITE_OTHER)
Admission: RE | Admit: 2015-06-14 | Discharge: 2015-06-14 | Disposition: A | Discharge status: Home / Self Care | Payer: Medicare Other | Source: Ambulatory Visit | Attending: Internal Medicine | Admitting: Internal Medicine

## 2015-06-14 ENCOUNTER — Other Ambulatory Visit (INDEPENDENT_AMBULATORY_CARE_PROVIDER_SITE_OTHER): Payer: Medicare Other

## 2015-06-14 ENCOUNTER — Encounter: Payer: Self-pay | Admitting: Internal Medicine

## 2015-06-14 VITALS — BP 130/78 | HR 64 | Ht 70.0 in | Wt 219.0 lb

## 2015-06-14 DIAGNOSIS — Z0000 Encounter for general adult medical examination without abnormal findings: Secondary | ICD-10-CM

## 2015-06-14 DIAGNOSIS — N320 Bladder-neck obstruction: Secondary | ICD-10-CM | POA: Diagnosis not present

## 2015-06-14 DIAGNOSIS — K22719 Barrett's esophagus with dysplasia, unspecified: Secondary | ICD-10-CM

## 2015-06-14 DIAGNOSIS — R05 Cough: Secondary | ICD-10-CM

## 2015-06-14 DIAGNOSIS — I10 Essential (primary) hypertension: Secondary | ICD-10-CM | POA: Diagnosis not present

## 2015-06-14 DIAGNOSIS — E785 Hyperlipidemia, unspecified: Secondary | ICD-10-CM | POA: Diagnosis not present

## 2015-06-14 DIAGNOSIS — R059 Cough, unspecified: Secondary | ICD-10-CM

## 2015-06-14 LAB — CBC WITH DIFFERENTIAL/PLATELET
BASOS ABS: 0 10*3/uL (ref 0.0–0.1)
Basophils Relative: 0.2 % (ref 0.0–3.0)
EOS PCT: 3.7 % (ref 0.0–5.0)
Eosinophils Absolute: 0.3 10*3/uL (ref 0.0–0.7)
HCT: 49 % (ref 39.0–52.0)
Hemoglobin: 16.3 g/dL (ref 13.0–17.0)
LYMPHS ABS: 1.7 10*3/uL (ref 0.7–4.0)
Lymphocytes Relative: 20.9 % (ref 12.0–46.0)
MCHC: 33.3 g/dL (ref 30.0–36.0)
MCV: 93.7 fl (ref 78.0–100.0)
MONO ABS: 0.7 10*3/uL (ref 0.1–1.0)
Monocytes Relative: 8.9 % (ref 3.0–12.0)
NEUTROS ABS: 5.4 10*3/uL (ref 1.4–7.7)
NEUTROS PCT: 66.3 % (ref 43.0–77.0)
PLATELETS: 202 10*3/uL (ref 150.0–400.0)
RBC: 5.23 Mil/uL (ref 4.22–5.81)
RDW: 13.2 % (ref 11.5–15.5)
WBC: 8.2 10*3/uL (ref 4.0–10.5)

## 2015-06-14 LAB — LIPID PANEL
CHOL/HDL RATIO: 4
CHOLESTEROL: 236 mg/dL — AB (ref 0–200)
HDL: 60 mg/dL (ref >39.00)
NONHDL: 176.18
Triglycerides: 260 mg/dL — ABNORMAL HIGH (ref 0.0–149.0)
VLDL: 52 mg/dL — ABNORMAL HIGH (ref 0.0–40.0)

## 2015-06-14 LAB — HEPATIC FUNCTION PANEL
ALT: 82 U/L — AB (ref 0–53)
AST: 43 U/L — AB (ref 0–37)
Albumin: 4.5 g/dL (ref 3.5–5.2)
Alkaline Phosphatase: 111 U/L (ref 39–117)
BILIRUBIN DIRECT: 0.2 mg/dL (ref 0.0–0.3)
BILIRUBIN TOTAL: 1.3 mg/dL — AB (ref 0.2–1.2)
Total Protein: 6.8 g/dL (ref 6.0–8.3)

## 2015-06-14 LAB — URINALYSIS
BILIRUBIN URINE: NEGATIVE
Hgb urine dipstick: NEGATIVE
KETONES UR: NEGATIVE
LEUKOCYTES UA: NEGATIVE
Nitrite: NEGATIVE
PH: 6 (ref 5.0–8.0)
SPECIFIC GRAVITY, URINE: 1.025 (ref 1.000–1.030)
Total Protein, Urine: NEGATIVE
URINE GLUCOSE: NEGATIVE
Urobilinogen, UA: 1 (ref 0.0–1.0)

## 2015-06-14 LAB — BASIC METABOLIC PANEL
BUN: 22 mg/dL (ref 6–23)
CO2: 30 mEq/L (ref 19–32)
Calcium: 9.6 mg/dL (ref 8.4–10.5)
Chloride: 103 mEq/L (ref 96–112)
Creatinine, Ser: 1.09 mg/dL (ref 0.40–1.50)
GFR: 69.43 mL/min (ref >60.00)
Glucose, Bld: 112 mg/dL — ABNORMAL HIGH (ref 70–99)
Potassium: 4.5 mEq/L (ref 3.5–5.1)
Sodium: 142 mEq/L (ref 135–145)

## 2015-06-14 LAB — URIC ACID: Uric Acid, Serum: 6.3 mg/dL (ref 4.0–7.8)

## 2015-06-14 LAB — TSH: TSH: 0.56 u[IU]/mL (ref 0.35–4.50)

## 2015-06-14 LAB — PSA: PSA: 0.78 ng/mL (ref 0.10–4.00)

## 2015-06-14 LAB — LDL CHOLESTEROL, DIRECT: LDL DIRECT: 126 mg/dL

## 2015-06-14 MED ORDER — AMLODIPINE BESYLATE 5 MG PO TABS: 5.0000 mg | ORAL_TABLET | Freq: Every day | ORAL | Status: DC

## 2015-06-14 MED ORDER — TAMSULOSIN HCL 0.4 MG PO CAPS: 0.4000 mg | ORAL_CAPSULE | Freq: Every day | ORAL | Status: DC

## 2015-06-14 NOTE — Patient Instructions (Signed)

## 2015-06-14 NOTE — Progress Notes (Signed)
Pre visit review using our clinic review tool, if applicable. No additional management support is needed unless otherwise documented below in the visit note. 

## 2015-06-14 NOTE — Assessment & Plan Note (Signed)
Here for medicare wellness/physical  Diet: heart healthy  Physical activity: not sedentary  Depression/mood screen: negative  Hearing: intact to whispered voice w/hearing aids Visual acuity: grossly normal, performs annual eye exam  ADLs: capable  Fall risk: none  Home safety: good  Cognitive evaluation: intact to orientation, naming, recall and repetition  EOL planning: adv directives, full code/ I agree  I have personally reviewed and have noted  1. The patient's medical, surgical and social history  2. Their use of alcohol, tobacco or illicit drugs  3. Their current medications and supplements  4. The patient's functional ability including ADL's, fall risks, home safety risks and hearing or visual impairment.  5. Diet and physical activities  6. Evidence for depression or mood disorders 7. The roster of all physicians providing medical care to patient - is listed in the Snapshot section of the chart and reviewed today.    Today patient counseled on age appropriate routine health concerns for screening and prevention, each reviewed and up to date or declined. Immunizations reviewed and up to date or declined. Labs ordered and reviewed. Risk factors for depression reviewed and negative. Hearing function and visual acuity are intact. ADLs screened and addressed as needed. Functional ability and level of safety reviewed and appropriate. Education, counseling and referrals performed based on assessed risks today. Patient provided with a copy of personalized plan for preventive services.

## 2015-06-14 NOTE — Progress Notes (Signed)
Subjective:  Patient ID: Todd Wolf, male    DOB: Jul 04, 1934  Age: 80 y.o. MRN: [id number]  CC: No chief complaint on file.   HPI Todd Wolf presents for well exam   Outpatient Prescriptions Prior to Visit  Medication Sig Dispense Refill  . benazepril (LOTENSIN) 40 MG tablet TAKE 1 TABLET BY MOUTH EVERY DAY 30 tablet 5  . cholecalciferol (VITAMIN D) 1000 UNITS tablet Take 2 tablets (2,000 Units total) by mouth daily. 100 tablet 3  . dutasteride (AVODART) 0.5 MG capsule TAKE 1 CAPSULE BY MOUTH DAILY 90 capsule 3  . esomeprazole (NEXIUM) 40 MG capsule Take 1 capsule (40 mg total) by mouth daily. 90 capsule 3  . febuxostat (ULORIC) 40 MG tablet Take 1 tablet (40 mg total) by mouth daily. 90 tablet 3  . scopolamine (TRANSDERM-SCOP) 1 MG/3DAYS Place 1 patch (1.5 mg total) onto the skin every 3 (three) days. 4 patch 1  . triamcinolone ointment (KENALOG) 0.5 % Apply 1 application topically 2 (two) times daily. 30 g 0  . WELCHOL 625 MG tablet TAKE 1 TABLET (625 MG TOTAL) BY MOUTH 2 (TWO) TIMES DAILY WITH A MEAL. 60 tablet 11  . amLODipine (NORVASC) 5 MG tablet Take 1 tablet (5 mg total) by mouth daily. 90 tablet 3  . tamsulosin (FLOMAX) 0.4 MG CAPS capsule Take 1 capsule (0.4 mg total) by mouth daily after supper. 90 capsule 3   No facility-administered medications prior to visit.    ROS Review of Systems  Constitutional: Negative for appetite change, fatigue and unexpected weight change.  HENT: Negative for congestion, nosebleeds, sneezing, sore throat and trouble swallowing.   Eyes: Negative for itching and visual disturbance.  Respiratory: Negative for cough.   Cardiovascular: Negative for chest pain, palpitations and leg swelling.  Gastrointestinal: Negative for nausea, diarrhea, blood in stool and abdominal distention.  Genitourinary: Negative for frequency and hematuria.  Musculoskeletal: Negative for back pain, joint swelling, gait problem and neck pain.  Skin:  Negative for rash.  Neurological: Negative for dizziness, tremors, speech difficulty and weakness.  Psychiatric/Behavioral: Negative for sleep disturbance, dysphoric mood and agitation. The patient is not nervous/anxious.     Objective:  BP 130/78 mmHg  Pulse 64  Ht 5\' 10"  (1.778 m)  Wt 219 lb (99.338 kg)  BMI 31.42 kg/m2  SpO2 96%  BP Readings from Last 3 Encounters:  06/14/15 130/78  04/25/15 130/80  09/28/14 130/82    Wt Readings from Last 3 Encounters:  06/14/15 219 lb (99.338 kg)  04/25/15 218 lb (98.884 kg)  09/28/14 223 lb (101.152 kg)    Physical Exam  Constitutional: He is oriented to person, place, and time. He appears well-developed. No distress.  NAD  HENT:  Mouth/Throat: Oropharynx is clear and moist.  Eyes: Conjunctivae are normal. Pupils are equal, round, and reactive to light.  Neck: Normal range of motion. No JVD present. No thyromegaly present.  Cardiovascular: Normal rate, regular rhythm, normal heart sounds and intact distal pulses.  Exam reveals no gallop and no friction rub.   No murmur heard. Pulmonary/Chest: Effort normal and breath sounds normal. No respiratory distress. He has no wheezes. He has no rales. He exhibits no tenderness.  Abdominal: Soft. Bowel sounds are normal. He exhibits no distension and no mass. There is no tenderness. There is no rebound and no guarding.  Genitourinary: Rectum normal. Guaiac negative stool.  Musculoskeletal: Normal range of motion. He exhibits no edema or tenderness.  Lymphadenopathy:  He has no cervical adenopathy.  Neurological: He is alert and oriented to person, place, and time. He has normal reflexes. No cranial nerve deficit. He exhibits normal muscle tone. He displays a negative Romberg sign. Coordination and gait normal.  Skin: Skin is warm and dry. No rash noted.  Psychiatric: He has a normal mood and affect. His behavior is normal. Judgment and thought content normal.  prostate 1+  Lab Results    Component Value Date   WBC 8.2 06/14/2015   HGB 16.3 06/14/2015   HCT 49.0 06/14/2015   PLT 202.0 06/14/2015   GLUCOSE 112* 06/14/2015   CHOL 236* 06/14/2015   TRIG 260.0* 06/14/2015   HDL 60.00 06/14/2015   LDLDIRECT 126.0 06/14/2015   ALT 82* 06/14/2015   AST 43* 06/14/2015   NA 142 06/14/2015   K 4.5 06/14/2015   CL 103 06/14/2015   CREATININE 1.09 06/14/2015   BUN 22 06/14/2015   CO2 30 06/14/2015   TSH 0.56 06/14/2015   PSA 0.78 06/14/2015   INR 1.0 09/01/2012   HGBA1C 5.4 06/08/2013    Dg Chest 2 View  12/04/2011  *RADIOLOGY REPORT* Clinical Data: History of dyspnea and granuloma.  History of hypertension. CHEST - 2 VIEW Comparison: 02/21/2009. Findings: Cardiac silhouette is upper limits of normal.  It appears stable.  Mediastinal and hilar contours appear stable.  Linear fibrotic change is seen in the left costophrenic angle region.  A small calcified granuloma is stable in the left lung base.  No active granulomatous process is identified. There is slight flattening of the diaphragm on lateral image which may reflect minimal hyperinflation.  No skeletal lesions are identified. IMPRESSION: Stable appearance of calcified lingular granuloma.  No active granulomatous process is identified. No pulmonary edema or pneumonia.  Slight flattening of diaphragm on lateral image may reflect minimal hyperinflation. Original Report Authenticated By: Delane Ginger, M.D.   Assessment & Plan:   Diagnoses and all orders for this visit:  Essential hypertension -     Basic metabolic panel; Future -     CBC with Differential/Platelet; Future -     TSH; Future -     Urinalysis; Future -     Uric acid; Future -     Hepatic function panel; Future -     Lipid panel; Future -     Hepatitis C antibody; Future -     PSA; Future  Barrett's esophagus with dysplasia -     Basic metabolic panel; Future -     CBC with Differential/Platelet; Future -     TSH; Future -     Urinalysis; Future -      Uric acid; Future -     Hepatic function panel; Future -     Lipid panel; Future -     Hepatitis C antibody; Future -     PSA; Future  Well adult exam -     Basic metabolic panel; Future -     CBC with Differential/Platelet; Future -     TSH; Future -     Urinalysis; Future -     Uric acid; Future -     Hepatic function panel; Future -     Lipid panel; Future -     Hepatitis C antibody; Future -     PSA; Future  Bladder neck obstruction -     Basic metabolic panel; Future -     CBC with Differential/Platelet; Future -     TSH; Future -  Urinalysis; Future -     Uric acid; Future -     Hepatic function panel; Future -     Lipid panel; Future -     Hepatitis C antibody; Future -     PSA; Future  Dyslipidemia -     Basic metabolic panel; Future -     CBC with Differential/Platelet; Future -     TSH; Future -     Urinalysis; Future -     Uric acid; Future -     Hepatic function panel; Future -     Lipid panel; Future -     Hepatitis C antibody; Future -     PSA; Future  Cough -     DG Chest 2 View  Other orders -     amLODipine (NORVASC) 5 MG tablet; Take 1 tablet (5 mg total) by mouth daily. -     tamsulosin (FLOMAX) 0.4 MG CAPS capsule; Take 1 capsule (0.4 mg total) by mouth daily after supper.   I am having Mr. Cirillo maintain his esomeprazole, febuxostat, scopolamine, benazepril, cholecalciferol, WELCHOL, triamcinolone ointment, dutasteride, aspirin EC, amLODipine, and tamsulosin.  Meds ordered this encounter  Medications  . aspirin EC 325 MG tablet    Sig: Take 325 mg by mouth daily.  Marland Kitchen amLODipine (NORVASC) 5 MG tablet    Sig: Take 1 tablet (5 mg total) by mouth daily.    Dispense:  90 tablet    Refill:  3  . tamsulosin (FLOMAX) 0.4 MG CAPS capsule    Sig: Take 1 capsule (0.4 mg total) by mouth daily after supper.    Dispense:  90 capsule    Refill:  3     Follow-up: Return in about 6 months (around 12/13/2015) for a follow-up visit.  [name], MD

## 2015-06-14 NOTE — Assessment & Plan Note (Signed)
Benazepril, amlodipine Rx 

## 2015-06-14 NOTE — Assessment & Plan Note (Signed)
Nexium 

## 2015-06-15 LAB — HEPATITIS C ANTIBODY: HCV AB: NEGATIVE

## 2015-07-07 ENCOUNTER — Other Ambulatory Visit: Payer: Self-pay

## 2015-07-07 MED ORDER — ESOMEPRAZOLE MAGNESIUM 40 MG PO CPDR: 40.0000 mg | DELAYED_RELEASE_CAPSULE | Freq: Every day | ORAL | Status: DC

## 2015-07-07 MED ORDER — BENAZEPRIL HCL 40 MG PO TABS: 40.0000 mg | ORAL_TABLET | Freq: Every day | ORAL | Status: DC

## 2015-07-07 MED ORDER — FEBUXOSTAT 40 MG PO TABS: 40.0000 mg | ORAL_TABLET | Freq: Every day | ORAL | Status: DC

## 2015-07-14 ENCOUNTER — Other Ambulatory Visit: Payer: Self-pay

## 2015-07-14 MED ORDER — ESOMEPRAZOLE MAGNESIUM 40 MG PO CPDR: 40.0000 mg | DELAYED_RELEASE_CAPSULE | Freq: Every day | ORAL | Status: DC

## 2015-07-20 ENCOUNTER — Other Ambulatory Visit: Payer: Self-pay | Admitting: Internal Medicine

## 2015-07-21 ENCOUNTER — Other Ambulatory Visit: Payer: Self-pay | Admitting: *Deleted

## 2015-07-21 MED ORDER — ESOMEPRAZOLE MAGNESIUM 40 MG PO CPDR
40.0000 mg | DELAYED_RELEASE_CAPSULE | Freq: Every day | ORAL | Status: DC
Start: 2015-07-21 — End: 2015-08-24

## 2015-08-22 DIAGNOSIS — L821 Other seborrheic keratosis: Secondary | ICD-10-CM | POA: Diagnosis not present

## 2015-08-22 DIAGNOSIS — D225 Melanocytic nevi of trunk: Secondary | ICD-10-CM | POA: Diagnosis not present

## 2015-08-22 DIAGNOSIS — L579 Skin changes due to chronic exposure to nonionizing radiation, unspecified: Secondary | ICD-10-CM | POA: Diagnosis not present

## 2015-08-22 DIAGNOSIS — L570 Actinic keratosis: Secondary | ICD-10-CM | POA: Diagnosis not present

## 2015-08-22 DIAGNOSIS — Z85828 Personal history of other malignant neoplasm of skin: Secondary | ICD-10-CM | POA: Diagnosis not present

## 2015-08-22 DIAGNOSIS — L814 Other melanin hyperpigmentation: Secondary | ICD-10-CM | POA: Diagnosis not present

## 2015-08-24 ENCOUNTER — Other Ambulatory Visit: Payer: Self-pay | Admitting: Internal Medicine

## 2015-12-21 ENCOUNTER — Other Ambulatory Visit: Payer: Self-pay | Admitting: *Deleted

## 2015-12-21 MED ORDER — COLESEVELAM HCL 625 MG PO TABS: ORAL_TABLET | ORAL | Status: DC

## 2015-12-27 ENCOUNTER — Other Ambulatory Visit: Payer: Self-pay | Admitting: Internal Medicine

## 2016-01-13 DIAGNOSIS — R109 Unspecified abdominal pain: Secondary | ICD-10-CM | POA: Diagnosis not present

## 2016-01-24 ENCOUNTER — Other Ambulatory Visit: Payer: Self-pay | Admitting: Internal Medicine

## 2016-02-20 DIAGNOSIS — L814 Other melanin hyperpigmentation: Secondary | ICD-10-CM | POA: Diagnosis not present

## 2016-02-20 DIAGNOSIS — Z85828 Personal history of other malignant neoplasm of skin: Secondary | ICD-10-CM | POA: Diagnosis not present

## 2016-02-20 DIAGNOSIS — L579 Skin changes due to chronic exposure to nonionizing radiation, unspecified: Secondary | ICD-10-CM | POA: Diagnosis not present

## 2016-02-20 DIAGNOSIS — D225 Melanocytic nevi of trunk: Secondary | ICD-10-CM | POA: Diagnosis not present

## 2016-02-20 DIAGNOSIS — L820 Inflamed seborrheic keratosis: Secondary | ICD-10-CM | POA: Diagnosis not present

## 2016-02-27 ENCOUNTER — Other Ambulatory Visit: Payer: Self-pay | Admitting: Internal Medicine

## 2016-03-08 ENCOUNTER — Other Ambulatory Visit (INDEPENDENT_AMBULATORY_CARE_PROVIDER_SITE_OTHER): Payer: Medicare Other

## 2016-03-08 ENCOUNTER — Encounter: Payer: Self-pay | Admitting: Internal Medicine

## 2016-03-08 ENCOUNTER — Ambulatory Visit (INDEPENDENT_AMBULATORY_CARE_PROVIDER_SITE_OTHER): Payer: Medicare Other | Admitting: Internal Medicine

## 2016-03-08 DIAGNOSIS — M1000 Idiopathic gout, unspecified site: Secondary | ICD-10-CM | POA: Diagnosis not present

## 2016-03-08 DIAGNOSIS — K22719 Barrett's esophagus with dysplasia, unspecified: Secondary | ICD-10-CM | POA: Diagnosis not present

## 2016-03-08 DIAGNOSIS — R0600 Dyspnea, unspecified: Secondary | ICD-10-CM

## 2016-03-08 DIAGNOSIS — Z23 Encounter for immunization: Secondary | ICD-10-CM

## 2016-03-08 DIAGNOSIS — I10 Essential (primary) hypertension: Secondary | ICD-10-CM

## 2016-03-08 DIAGNOSIS — R197 Diarrhea, unspecified: Secondary | ICD-10-CM

## 2016-03-08 LAB — HEPATIC FUNCTION PANEL
ALBUMIN: 4.4 g/dL (ref 3.5–5.2)
ALT: 46 U/L (ref 0–53)
AST: 29 U/L (ref 0–37)
Alkaline Phosphatase: 99 U/L (ref 39–117)
BILIRUBIN DIRECT: 0.2 mg/dL (ref 0.0–0.3)
TOTAL PROTEIN: 6.9 g/dL (ref 6.0–8.3)
Total Bilirubin: 0.9 mg/dL (ref 0.2–1.2)

## 2016-03-08 LAB — URIC ACID: URIC ACID, SERUM: 6 mg/dL (ref 4.0–7.8)

## 2016-03-08 LAB — CBC WITH DIFFERENTIAL/PLATELET
BASOS ABS: 0 10*3/uL (ref 0.0–0.1)
Basophils Relative: 0.4 % (ref 0.0–3.0)
EOS PCT: 3.8 % (ref 0.0–5.0)
Eosinophils Absolute: 0.3 10*3/uL (ref 0.0–0.7)
HEMATOCRIT: 46.8 % (ref 39.0–52.0)
Hemoglobin: 16.1 g/dL (ref 13.0–17.0)
LYMPHS ABS: 1.6 10*3/uL (ref 0.7–4.0)
LYMPHS PCT: 20.9 % (ref 12.0–46.0)
MCHC: 34.3 g/dL (ref 30.0–36.0)
MCV: 91.4 fl (ref 78.0–100.0)
MONOS PCT: 10.2 % (ref 3.0–12.0)
Monocytes Absolute: 0.8 10*3/uL (ref 0.1–1.0)
NEUTROS ABS: 5 10*3/uL (ref 1.4–7.7)
Neutrophils Relative %: 64.7 % (ref 43.0–77.0)
Platelets: 198 10*3/uL (ref 150.0–400.0)
RBC: 5.12 Mil/uL (ref 4.22–5.81)
RDW: 13.5 % (ref 11.5–15.5)
WBC: 7.8 10*3/uL (ref 4.0–10.5)

## 2016-03-08 LAB — BASIC METABOLIC PANEL
BUN: 19 mg/dL (ref 6–23)
CALCIUM: 9.2 mg/dL (ref 8.4–10.5)
CHLORIDE: 104 meq/L (ref 96–112)
CO2: 32 meq/L (ref 19–32)
Creatinine, Ser: 1.05 mg/dL (ref 0.40–1.50)
GFR: 72.35 mL/min (ref >60.00)
GLUCOSE: 84 mg/dL (ref 70–99)
POTASSIUM: 4.5 meq/L (ref 3.5–5.1)
SODIUM: 141 meq/L (ref 135–145)

## 2016-03-08 LAB — URINALYSIS
BILIRUBIN URINE: NEGATIVE
Hgb urine dipstick: NEGATIVE
KETONES UR: NEGATIVE
LEUKOCYTES UA: NEGATIVE
Nitrite: NEGATIVE
PH: 7.5 (ref 5.0–8.0)
Specific Gravity, Urine: 1.015 (ref 1.000–1.030)
Total Protein, Urine: NEGATIVE
URINE GLUCOSE: NEGATIVE
Urobilinogen, UA: 1 (ref 0.0–1.0)

## 2016-03-08 LAB — TSH: TSH: 0.55 u[IU]/mL (ref 0.35–4.50)

## 2016-03-08 MED ORDER — TAMSULOSIN HCL 0.4 MG PO CAPS: 0.4000 mg | ORAL_CAPSULE | Freq: Every day | ORAL | 3 refills | Status: DC

## 2016-03-08 MED ORDER — COLESEVELAM HCL 625 MG PO TABS: ORAL_TABLET | ORAL | 5 refills | Status: DC

## 2016-03-08 MED ORDER — DUTASTERIDE 0.5 MG PO CAPS: 0.5000 mg | ORAL_CAPSULE | Freq: Every day | ORAL | 3 refills | Status: DC

## 2016-03-08 MED ORDER — FEBUXOSTAT 40 MG PO TABS: 40.0000 mg | ORAL_TABLET | Freq: Every day | ORAL | 3 refills | Status: DC

## 2016-03-08 MED ORDER — AZITHROMYCIN 250 MG PO TABS: ORAL_TABLET | ORAL | 0 refills | Status: DC

## 2016-03-08 MED ORDER — BENAZEPRIL HCL 40 MG PO TABS
40.0000 mg | ORAL_TABLET | Freq: Every day | ORAL | 3 refills | Status: DC
Start: 2016-03-08 — End: 2016-08-27

## 2016-03-08 MED ORDER — AMLODIPINE BESYLATE 5 MG PO TABS: 5.0000 mg | ORAL_TABLET | Freq: Every day | ORAL | 3 refills | Status: DC

## 2016-03-08 MED ORDER — ESOMEPRAZOLE MAGNESIUM 40 MG PO CPDR: 40.0000 mg | DELAYED_RELEASE_CAPSULE | Freq: Every day | ORAL | 3 refills | Status: DC

## 2016-03-08 NOTE — Progress Notes (Addendum)
Subjective:  Patient ID: Denton Lank, male    DOB: [birth year]  Age: 81 y.o. MRN: [id number]  CC: No chief complaint on file.   HPI Leor Deforest Hoyles JR presents for a check up. Clair Gulling is leaving on a hunting trip where he has to climb a mountain and get down in 1 day for 3 days in a raw. He has done it before twice. He has been walking hills 2-3 miles a day  F/u GERD, HTN, gout  Outpatient Medications Prior to Visit  Medication Sig Dispense Refill  . amLODipine (NORVASC) 5 MG tablet TAKE 1 TABLET BY MOUTH DAILY 90 tablet 3  . aspirin EC 325 MG tablet Take 325 mg by mouth daily.    . benazepril (LOTENSIN) 40 MG tablet TAKE 1 TABLET BY MOUTH EVERY DAY 90 tablet 1  . cholecalciferol (VITAMIN D) 1000 UNITS tablet Take 2 tablets (2,000 Units total) by mouth daily. 100 tablet 3  . colesevelam (WELCHOL) 625 MG tablet TAKE 1 TABLET (625 MG TOTAL) BY MOUTH 2 (TWO) TIMES DAILY WITH A MEAL. 60 tablet 5  . dutasteride (AVODART) 0.5 MG capsule TAKE 1 CAPSULE BY MOUTH DAILY 90 capsule 3  . esomeprazole (NEXIUM) 40 MG capsule TAKE 1 CAPSULE BY MOUTH DAILY 90 capsule 3  . febuxostat (ULORIC) 40 MG tablet Take 1 tablet (40 mg total) by mouth daily. 90 tablet 3  . scopolamine (TRANSDERM-SCOP) 1 MG/3DAYS Place 1 patch (1.5 mg total) onto the skin every 3 (three) days. 4 patch 1  . tamsulosin (FLOMAX) 0.4 MG CAPS capsule TAKE 1 CAPSULE (0.4 MG TOTAL) BY MOUTH DAILY AFTER SUPPER. 90 capsule 3  . triamcinolone ointment (KENALOG) 0.5 % Apply 1 application topically 2 (two) times daily. 30 g 0   No facility-administered medications prior to visit.     ROS Review of Systems  Constitutional: Negative for appetite change, fatigue and unexpected weight change.  HENT: Negative for congestion, nosebleeds, sneezing, sore throat and trouble swallowing.   Eyes: Negative for itching and visual disturbance.  Respiratory: Negative for cough.   Cardiovascular: Negative for chest pain, palpitations and leg  swelling.  Gastrointestinal: Negative for abdominal distention, blood in stool, diarrhea and nausea.  Genitourinary: Negative for frequency and hematuria.  Musculoskeletal: Negative for back pain, gait problem, joint swelling and neck pain.  Skin: Negative for rash.  Neurological: Negative for dizziness, tremors, speech difficulty and weakness.  Psychiatric/Behavioral: Negative for agitation, dysphoric mood and sleep disturbance. The patient is not nervous/anxious.     Objective:  BP 126/78   Temp 98.2 F (36.8 C)   Wt 218 lb 0.6 oz (98.9 kg)   SpO2 98%   BMI 31.29 kg/m   BP Readings from Last 3 Encounters:  03/08/16 126/78  06/14/15 130/78  04/25/15 130/80    Wt Readings from Last 3 Encounters:  03/08/16 218 lb 0.6 oz (98.9 kg)  06/14/15 219 lb (99.3 kg)  04/25/15 218 lb (98.9 kg)    Physical Exam  Constitutional: He is oriented to person, place, and time. He appears well-developed. No distress.  NAD  HENT:  Mouth/Throat: Oropharynx is clear and moist.  Eyes: Conjunctivae are normal. Pupils are equal, round, and reactive to light.  Neck: Normal range of motion. No JVD present. No thyromegaly present.  Cardiovascular: Normal rate, regular rhythm, normal heart sounds and intact distal pulses.  Exam reveals no gallop and no friction rub.   No murmur heard. Pulmonary/Chest: Effort normal and breath sounds normal. No  respiratory distress. He has no wheezes. He has no rales. He exhibits no tenderness.  Abdominal: Soft. Bowel sounds are normal. He exhibits no distension and no mass. There is no tenderness. There is no rebound and no guarding.  Musculoskeletal: Normal range of motion. He exhibits no edema or tenderness.  Lymphadenopathy:    He has no cervical adenopathy.  Neurological: He is alert and oriented to person, place, and time. He has normal reflexes. No cranial nerve deficit. He exhibits normal muscle tone. He displays a negative Romberg sign. Coordination and gait  normal.  Skin: Skin is warm and dry. No rash noted.  Psychiatric: He has a normal mood and affect. His behavior is normal. Judgment and thought content normal.   Procedure: EKG Indication: DOE Impression: NSR. No acute changes.   Lab Results  Component Value Date   WBC 8.2 06/14/2015   HGB 16.3 06/14/2015   HCT 49.0 06/14/2015   PLT 202.0 06/14/2015   GLUCOSE 112 (H) 06/14/2015   CHOL 236 (H) 06/14/2015   TRIG 260.0 (H) 06/14/2015   HDL 60.00 06/14/2015   LDLDIRECT 126.0 06/14/2015   ALT 82 (H) 06/14/2015   AST 43 (H) 06/14/2015   NA 142 06/14/2015   K 4.5 06/14/2015   CL 103 06/14/2015   CREATININE 1.09 06/14/2015   BUN 22 06/14/2015   CO2 30 06/14/2015   TSH 0.56 06/14/2015   PSA 0.78 06/14/2015   INR 1.0 09/01/2012   HGBA1C 5.4 06/08/2013    Dg Chest 2 View  Result Date: 06/14/2015 CLINICAL DATA:  Cough, hypertension EXAM: CHEST  2 VIEW COMPARISON:  12/04/2011 FINDINGS: Small calcified granuloma in the left base. Linear scarring in the lingula. Right lung is clear. Heart is normal size. No effusions or acute bony abnormality. IMPRESSION: No active cardiopulmonary disease. Electronically Signed   By: Rolm Baptise M.D.   On: 06/14/2015 10:36    Assessment & Plan:   There are no diagnoses linked to this encounter. I am having Mr. Brandsma maintain his scopolamine, cholecalciferol, triamcinolone ointment, dutasteride, aspirin EC, febuxostat, amLODipine, esomeprazole, tamsulosin, colesevelam, and benazepril.  No orders of the defined types were placed in this encounter.    Follow-up: No Follow-up on file.  [name], MD

## 2016-03-08 NOTE — Assessment & Plan Note (Addendum)
Normal exertional dyspnea. OK to go on a trip EKG

## 2016-03-08 NOTE — Assessment & Plan Note (Addendum)
Labs Colchicine prn 

## 2016-03-08 NOTE — Assessment & Plan Note (Signed)
Benazepril, amlodipine

## 2016-03-08 NOTE — Assessment & Plan Note (Signed)
Nexium 

## 2016-03-08 NOTE — Assessment & Plan Note (Signed)
Doing well 

## 2016-03-08 NOTE — Progress Notes (Signed)
Pre visit review using our clinic review tool, if applicable. No additional management support is needed unless otherwise documented below in the visit note. 

## 2016-03-13 ENCOUNTER — Encounter: Payer: Self-pay | Admitting: Internal Medicine

## 2016-03-14 ENCOUNTER — Encounter: Payer: Self-pay | Admitting: Internal Medicine

## 2016-06-11 DIAGNOSIS — H5203 Hypermetropia, bilateral: Secondary | ICD-10-CM | POA: Diagnosis not present

## 2016-06-11 DIAGNOSIS — H2513 Age-related nuclear cataract, bilateral: Secondary | ICD-10-CM | POA: Diagnosis not present

## 2016-07-13 ENCOUNTER — Telehealth: Payer: Self-pay | Admitting: Internal Medicine

## 2016-07-13 MED ORDER — LEVOFLOXACIN 500 MG PO TABS
500.0000 mg | ORAL_TABLET | Freq: Every day | ORAL | 0 refills | Status: AC
Start: 2016-07-13 — End: 2016-07-23

## 2016-07-13 NOTE — Telephone Encounter (Signed)
URI x 3 wks Levaquin x 10 d Warned re tendonitis etc Thx

## 2016-08-20 DIAGNOSIS — L579 Skin changes due to chronic exposure to nonionizing radiation, unspecified: Secondary | ICD-10-CM | POA: Diagnosis not present

## 2016-08-20 DIAGNOSIS — Z85828 Personal history of other malignant neoplasm of skin: Secondary | ICD-10-CM | POA: Diagnosis not present

## 2016-08-20 DIAGNOSIS — L821 Other seborrheic keratosis: Secondary | ICD-10-CM | POA: Diagnosis not present

## 2016-08-20 DIAGNOSIS — D485 Neoplasm of uncertain behavior of skin: Secondary | ICD-10-CM | POA: Diagnosis not present

## 2016-08-20 DIAGNOSIS — L814 Other melanin hyperpigmentation: Secondary | ICD-10-CM | POA: Diagnosis not present

## 2016-08-20 DIAGNOSIS — D225 Melanocytic nevi of trunk: Secondary | ICD-10-CM | POA: Diagnosis not present

## 2016-08-20 DIAGNOSIS — D492 Neoplasm of unspecified behavior of bone, soft tissue, and skin: Secondary | ICD-10-CM | POA: Diagnosis not present

## 2016-08-27 ENCOUNTER — Other Ambulatory Visit: Payer: Self-pay | Admitting: *Deleted

## 2016-08-27 MED ORDER — BENAZEPRIL HCL 40 MG PO TABS: 40.0000 mg | ORAL_TABLET | Freq: Every day | ORAL | 2 refills | Status: DC

## 2016-08-29 ENCOUNTER — Encounter: Payer: Self-pay | Admitting: Internal Medicine

## 2016-08-29 MED ORDER — BENAZEPRIL HCL 40 MG PO TABS: 40.0000 mg | ORAL_TABLET | Freq: Every day | ORAL | 2 refills | Status: DC

## 2016-08-29 NOTE — Telephone Encounter (Signed)
Rec'd fax Benazepril went to wrong pharmacy. Rx should have went to [facility]/ [address] in [place]. Resent to correct pharmacy...Todd Wolf

## 2016-08-29 NOTE — Addendum Note (Signed)
Addended by: Earnstine Regal on: 08/29/2016 02:15 PM   Modules accepted: Orders

## 2016-09-19 ENCOUNTER — Encounter: Payer: Self-pay | Admitting: Internal Medicine

## 2016-09-25 ENCOUNTER — Encounter: Payer: Self-pay | Admitting: Internal Medicine

## 2016-09-26 ENCOUNTER — Telehealth: Payer: Self-pay

## 2016-09-26 NOTE — Telephone Encounter (Signed)
PA Uloric Key: [id number] through Cover My Meds

## 2016-09-27 ENCOUNTER — Telehealth: Payer: Self-pay

## 2016-09-27 ENCOUNTER — Telehealth: Payer: Self-pay | Admitting: Internal Medicine

## 2016-09-27 NOTE — Telephone Encounter (Signed)
Pls send another PA - Pt tried and failed Allopurinol in the past Thx

## 2016-09-27 NOTE — Telephone Encounter (Signed)
Patients prior authorization on Uloric was denied due to generic Allopurinol not being tried and failed. Please advise

## 2016-09-27 NOTE — Telephone Encounter (Signed)
Prior Authorization febuxostat (ULORIC) 40 MG has been denied. Does not meet criteria.

## 2016-09-28 NOTE — Telephone Encounter (Signed)
Allopurinol was ineffective Thx

## 2016-09-28 NOTE — Telephone Encounter (Signed)
Do you recall why Allopurinol was D/C? Was in an allergy or an intolerance?

## 2016-09-28 NOTE — Telephone Encounter (Signed)
New PA started.

## 2016-10-01 NOTE — Telephone Encounter (Signed)
Fax sent to [facility] 

## 2016-10-01 NOTE — Telephone Encounter (Signed)
Error last message//BEF

## 2016-10-19 NOTE — Telephone Encounter (Signed)
Appeal has been approved, pharmacy notified.

## 2016-10-30 ENCOUNTER — Encounter: Payer: Self-pay | Admitting: Internal Medicine

## 2016-11-22 ENCOUNTER — Encounter: Payer: Self-pay | Admitting: Internal Medicine

## 2016-12-31 ENCOUNTER — Ambulatory Visit (AMBULATORY_SURGERY_CENTER): Payer: Self-pay | Admitting: *Deleted

## 2016-12-31 VITALS — Ht 70.0 in | Wt 214.4 lb

## 2016-12-31 DIAGNOSIS — Z8719 Personal history of other diseases of the digestive system: Secondary | ICD-10-CM

## 2016-12-31 DIAGNOSIS — Z860100 Personal history of colon polyps, unspecified: Secondary | ICD-10-CM

## 2016-12-31 MED ORDER — NA SULFATE-K SULFATE-MG SULF 17.5-3.13-1.6 GM/177ML PO SOLN: ORAL | 0 refills | Status: DC

## 2016-12-31 NOTE — Progress Notes (Signed)
No allergies to eggs or soy. No problems with anesthesia.  Pt given Emmi instructions for colonoscopy/egd  No oxygen use  No diet drug use  

## 2017-01-16 ENCOUNTER — Encounter: Payer: Self-pay | Admitting: Internal Medicine

## 2017-01-16 ENCOUNTER — Ambulatory Visit (AMBULATORY_SURGERY_CENTER): Payer: Medicare Other | Admitting: Internal Medicine

## 2017-01-16 VITALS — BP 140/82 | HR 64 | Temp 97.5°F | Resp 10 | Ht 70.0 in | Wt 214.0 lb

## 2017-01-16 DIAGNOSIS — Z8719 Personal history of other diseases of the digestive system: Secondary | ICD-10-CM

## 2017-01-16 DIAGNOSIS — Z86010 Personal history of colonic polyps: Secondary | ICD-10-CM | POA: Diagnosis not present

## 2017-01-16 DIAGNOSIS — D120 Benign neoplasm of cecum: Secondary | ICD-10-CM | POA: Diagnosis not present

## 2017-01-16 DIAGNOSIS — K861 Other chronic pancreatitis: Secondary | ICD-10-CM | POA: Diagnosis not present

## 2017-01-16 DIAGNOSIS — K2270 Barrett's esophagus without dysplasia: Secondary | ICD-10-CM

## 2017-01-16 DIAGNOSIS — I10 Essential (primary) hypertension: Secondary | ICD-10-CM | POA: Diagnosis not present

## 2017-01-16 MED ORDER — SODIUM CHLORIDE 0.9 % IV SOLN: 500.0000 mL | INTRAVENOUS | Status: AC

## 2017-01-16 NOTE — Op Note (Signed)
[place] Patient Name: Todd Wolf Procedure Date: 01/16/2017 11:45 AM MRN: [id number] Endoscopist: Docia Chuck. Henrene Pastor , MD Age: 82 Referring MD:  Date of Birth: 04-Aug-1934 Gender: Male Account #: [id number] Procedure:                Colonoscopy, with cold snare polypectomy x 1 Indications:              High risk colon cancer surveillance: Personal                            history of adenoma (10 mm or greater in size).                            Multiple previous exams with Dr. Sharlett Iles and Dr.                            Velora Heckler. Last exam May 2015 with diminutive adenoma Medicines:                Monitored Anesthesia Care Procedure:                Pre-Anesthesia Assessment:                           - Prior to the procedure, a History and Physical                            was performed, and patient medications and                            allergies were reviewed. The patient's tolerance of                            previous anesthesia was also reviewed. The risks                            and benefits of the procedure and the sedation                            options and risks were discussed with the patient.                            All questions were answered, and informed consent                            was obtained. Prior Anticoagulants: The patient has                            taken no previous anticoagulant or antiplatelet                            agents. ASA Grade Assessment: II - A patient with                            mild systemic disease. After reviewing the risks  and benefits, the patient was deemed in                            satisfactory condition to undergo the procedure.                           After obtaining informed consent, the colonoscope                            was passed under direct vision. Throughout the                            procedure, the patient's blood pressure, pulse, and                  oxygen saturations were monitored continuously. The                            Colonoscope was introduced through the anus and                            advanced to the the cecum, identified by                            appendiceal orifice and ileocecal valve. The                            ileocecal valve, appendiceal orifice, and rectum                            were photographed. The quality of the bowel                            preparation was excellent. The colonoscopy was                            performed without difficulty. The patient tolerated                            the procedure well. The bowel preparation used was                            SUPREP. Scope In: 11:53:01 AM Scope Out: 12:08:29 PM Scope Withdrawal Time: 0 hours 12 minutes 55 seconds  Total Procedure Duration: 0 hours 15 minutes 28 seconds  Findings:                 A 2 mm polyp was found in the cecum. The polyp was                            removed with a cold snare. Resection and retrieval                            were complete.  There was an isolated diverticulum in the area of                            the hepatic flexure.The exam was otherwise without                            abnormality on direct and retroflexion views.                            Internal hemorrhoids present. Complications:            No immediate complications. Estimated blood loss:                            None. Estimated Blood Loss:     Estimated blood loss: none. Impression:               - One 2 mm polyp in the cecum, removed with a cold                            snare. Resected and retrieved. Incidental                            diverticulum and internal hemorrhoids.                           - The examination was otherwise normal on direct                            and retroflexion views. Recommendation:           - Repeat colonoscopy is not recommended for                             surveillance.                           - Patient has a contact number available for                            emergencies. The signs and symptoms of potential                            delayed complications were discussed with the                            patient. Return to normal activities tomorrow.                            Written discharge instructions were provided to the                            patient.                           - Resume previous diet.                           -  Continue present medications.                           - Await pathology results. Docia Chuck. Henrene Pastor, MD 01/16/2017 12:12:45 PM This report has been signed electronically.

## 2017-01-16 NOTE — Progress Notes (Signed)
To recovery, report to Thomas, RN, VSS 

## 2017-01-16 NOTE — Progress Notes (Signed)
Called to room to assist during endoscopic procedure.  Patient ID and intended procedure confirmed with present staff. Received instructions for my participation in the procedure from the performing physician.  

## 2017-01-16 NOTE — Progress Notes (Signed)
Pt's states no medical or surgical changes since previsit or office visit. 

## 2017-01-16 NOTE — Patient Instructions (Addendum)
Information on polyps an d diverticulosis given to you today.  Await pathology results.  YOU HAD AN ENDOSCOPIC PROCEDURE TODAY AT THE [facility]:   Refer to the procedure report that was given to you for any specific questions about what was found during the examination.  If the procedure report does not answer your questions, please call your gastroenterologist to clarify.  If you requested that your care partner not be given the details of your procedure findings, then the procedure report has been included in a sealed envelope for you to review at your convenience later.  YOU SHOULD EXPECT: Some feelings of bloating in the abdomen. Passage of more gas than usual.  Walking can help get rid of the air that was put into your GI tract during the procedure and reduce the bloating. If you had a lower endoscopy (such as a colonoscopy or flexible sigmoidoscopy) you may notice spotting of blood in your stool or on the toilet paper. If you underwent a bowel prep for your procedure, you may not have a normal bowel movement for a few days.  Please Note:  You might notice some irritation and congestion in your nose or some drainage.  This is from the oxygen used during your procedure.  There is no need for concern and it should clear up in a day or so.  SYMPTOMS TO REPORT IMMEDIATELY:   Following lower endoscopy (colonoscopy or flexible sigmoidoscopy):  Excessive amounts of blood in the stool  Significant tenderness or worsening of abdominal pains  Swelling of the abdomen that is new, acute  Fever of 100F or higher   Following upper endoscopy (EGD)  Vomiting of blood or coffee ground material  New chest pain or pain under the shoulder blades  Painful or persistently difficult swallowing  New shortness of breath  Fever of 100F or higher  Black, tarry-looking stools  For urgent or emergent issues, a gastroenterologist can be reached at any hour by calling [phone].   DIET:   We do recommend a small meal at first, but then you may proceed to your regular diet.  Drink plenty of fluids but you should avoid alcoholic beverages for 24 hours.  ACTIVITY:  You should plan to take it easy for the rest of today and you should NOT DRIVE or use heavy machinery until tomorrow (because of the sedation medicines used during the test).    FOLLOW UP: Our staff will call the number listed on your records the next business day following your procedure to check on you and address any questions or concerns that you may have regarding the information given to you following your procedure. If we do not reach you, we will leave a message.  However, if you are feeling well and you are not experiencing any problems, there is no need to return our call.  We will assume that you have returned to your regular daily activities without incident.  If any biopsies were taken you will be contacted by phone or by letter within the next 1-3 weeks.  Please call us at [phone] if you have not heard about the biopsies in 3 weeks.    SIGNATURES/CONFIDENTIALITY: You and/or your care partner have signed paperwork which will be entered into your electronic medical record.  These signatures attest to the fact that that the information above on your After Visit Summary has been reviewed and is understood.  Full responsibility of the confidentiality of this discharge information lies with you   with you and/or your care-partner.

## 2017-01-16 NOTE — Op Note (Signed)
[facility] Patient Name: Todd Wolf Procedure Date: 01/16/2017 11:45 AM MRN: [id number] Endoscopist: Docia Chuck. Henrene Pastor , MD Age: 82 Referring MD:  Date of Birth: 1935-04-13 Gender: Male Account #: [id number] Procedure:                Upper GI endoscopy, with biopsies Indications:              Surveillance for malignancy due to personal history                            of Barrett's esophagus. Established nondysplastic                            Barrett's Medicines:                Monitored Anesthesia Care Procedure:                Pre-Anesthesia Assessment:                           - Prior to the procedure, a History and Physical                            was performed, and patient medications and                            allergies were reviewed. The patient's tolerance of                            previous anesthesia was also reviewed. The risks                            and benefits of the procedure and the sedation                            options and risks were discussed with the patient.                            All questions were answered, and informed consent                            was obtained. Prior Anticoagulants: The patient has                            taken no previous anticoagulant or antiplatelet                            agents. ASA Grade Assessment: II - A patient with                            mild systemic disease. After reviewing the risks                            and benefits, the patient was deemed in  satisfactory condition to undergo the procedure.                           After obtaining informed consent, the endoscope was                            passed under direct vision. Throughout the                            procedure, the patient's blood pressure, pulse, and                            oxygen saturations were monitored continuously. The                            Model GIF-HQ190 [id number])  scope was introduced                            through the mouth, and advanced to the second part                            of duodenum. The upper GI endoscopy was                            accomplished without difficulty. The patient                            tolerated the procedure well. Scope In: Scope Out: Findings:                 The proximal two thirds of the esophagus was normal.                           There were esophageal mucosal changes secondary to                            established long-segment Barrett's disease present                            in the lower third of the esophagus. Examined with                            white light and NBI. Regular and magnification                            power. No inflammation or nodularity. Prauge                            classification C4,M5. Mucosa was biopsied with a                            cold forceps for histology in 4 quadrants at  intervals of 1.5 cm in the middle third of the                            esophagus.                           The stomach was normal, save a few benign fundic                            gland type polyps.                           The examined duodenum was normal.                           The cardia and gastric fundus were normal on                            retroflexion. Small hiatal hernia present. Complications:            No immediate complications. Estimated Blood Loss:     Estimated blood loss: none. Impression:               - Esophageal mucosal changes secondary to                            established long-segment Barrett's disease.                            Biopsied.                           - Otherwise unremarkable EGD. Recommendation:           - Patient has a contact number available for                            emergencies. The signs and symptoms of potential                            delayed complications were discussed with the                             patient. Return to normal activities tomorrow.                            Written discharge instructions were provided to the                            patient.                           - Resume previous diet.                           - Continue present medications.                           -  Follow-up pathology. If no dysplasia on biopsies,                            then no further surveillance required Docia Chuck. Henrene Pastor, MD 01/16/2017 78:71:83 PM This report has been signed electronically.

## 2017-01-17 ENCOUNTER — Telehealth: Payer: Self-pay | Admitting: *Deleted

## 2017-01-17 ENCOUNTER — Telehealth: Payer: Self-pay

## 2017-01-17 NOTE — Telephone Encounter (Signed)
Left message on answering machine. 

## 2017-01-17 NOTE — Telephone Encounter (Signed)
  Follow up Call-  Call back number 01/16/2017  Post procedure Call Back phone  # [phone]  Permission to leave phone message Yes  Some recent data might be hidden     Patient questions:  Do you have a fever, pain , or abdominal swelling? No. Pain Score  0 *  Have you tolerated food without any problems? Yes.    Have you been able to return to your normal activities? Yes.    Do you have any questions about your discharge instructions: Diet   No. Medications  No. Follow up visit  No.  Do you have questions or concerns about your Care? No.  Actions: * If pain score is 4 or above: No action needed, pain <4.

## 2017-01-21 ENCOUNTER — Encounter: Payer: Self-pay | Admitting: Internal Medicine

## 2017-01-22 ENCOUNTER — Encounter: Payer: Self-pay | Admitting: Internal Medicine

## 2017-02-18 DIAGNOSIS — L579 Skin changes due to chronic exposure to nonionizing radiation, unspecified: Secondary | ICD-10-CM | POA: Diagnosis not present

## 2017-02-18 DIAGNOSIS — L570 Actinic keratosis: Secondary | ICD-10-CM | POA: Diagnosis not present

## 2017-02-18 DIAGNOSIS — L814 Other melanin hyperpigmentation: Secondary | ICD-10-CM | POA: Diagnosis not present

## 2017-02-18 DIAGNOSIS — D1801 Hemangioma of skin and subcutaneous tissue: Secondary | ICD-10-CM | POA: Diagnosis not present

## 2017-02-18 DIAGNOSIS — L821 Other seborrheic keratosis: Secondary | ICD-10-CM | POA: Diagnosis not present

## 2017-02-18 DIAGNOSIS — Z85828 Personal history of other malignant neoplasm of skin: Secondary | ICD-10-CM | POA: Diagnosis not present

## 2017-05-14 ENCOUNTER — Telehealth: Payer: Self-pay | Admitting: Internal Medicine

## 2017-05-14 MED ORDER — DUTASTERIDE 0.5 MG PO CAPS: 0.5000 mg | ORAL_CAPSULE | Freq: Every day | ORAL | 0 refills | Status: DC

## 2017-05-14 MED ORDER — BENAZEPRIL HCL 40 MG PO TABS: 40.0000 mg | ORAL_TABLET | Freq: Every day | ORAL | 0 refills | Status: DC

## 2017-05-14 MED ORDER — AMLODIPINE BESYLATE 5 MG PO TABS: 5.0000 mg | ORAL_TABLET | Freq: Every day | ORAL | 0 refills | Status: DC

## 2017-05-14 MED ORDER — ESOMEPRAZOLE MAGNESIUM 40 MG PO CPDR: 40.0000 mg | DELAYED_RELEASE_CAPSULE | Freq: Every day | ORAL | 0 refills | Status: DC

## 2017-05-14 NOTE — Telephone Encounter (Signed)
Pt called and has set up an appt for his annual appointment  Need a refill of  amLODipine (NORVASC) 5 MG tablet benazepril (LOTENSIN) 40 MG tablet dutasteride (AVODART) 0.5 MG capsule  esomeprazole (NEXIUM) 40 MG capsule  Please advise  POF

## 2017-05-14 NOTE — Telephone Encounter (Signed)
Per office policy sent 30 day to local pharmacy until appt.../lmb  

## 2017-05-27 ENCOUNTER — Ambulatory Visit (INDEPENDENT_AMBULATORY_CARE_PROVIDER_SITE_OTHER): Payer: Medicare Other | Admitting: Internal Medicine

## 2017-05-27 ENCOUNTER — Ambulatory Visit (INDEPENDENT_AMBULATORY_CARE_PROVIDER_SITE_OTHER)
Admission: RE | Admit: 2017-05-27 | Discharge: 2017-05-27 | Disposition: A | Discharge status: Home / Self Care | Payer: Medicare Other | Source: Ambulatory Visit | Attending: Internal Medicine | Admitting: Internal Medicine

## 2017-05-27 ENCOUNTER — Encounter: Payer: Self-pay | Admitting: Internal Medicine

## 2017-05-27 ENCOUNTER — Other Ambulatory Visit (INDEPENDENT_AMBULATORY_CARE_PROVIDER_SITE_OTHER): Payer: Medicare Other

## 2017-05-27 VITALS — BP 152/78 | HR 59 | Temp 97.9°F | Ht 70.0 in | Wt 218.0 lb

## 2017-05-27 DIAGNOSIS — R739 Hyperglycemia, unspecified: Secondary | ICD-10-CM | POA: Diagnosis not present

## 2017-05-27 DIAGNOSIS — Z0000 Encounter for general adult medical examination without abnormal findings: Secondary | ICD-10-CM

## 2017-05-27 DIAGNOSIS — N320 Bladder-neck obstruction: Secondary | ICD-10-CM

## 2017-05-27 DIAGNOSIS — R5383 Other fatigue: Secondary | ICD-10-CM

## 2017-05-27 DIAGNOSIS — Z23 Encounter for immunization: Secondary | ICD-10-CM

## 2017-05-27 DIAGNOSIS — M1000 Idiopathic gout, unspecified site: Secondary | ICD-10-CM | POA: Diagnosis not present

## 2017-05-27 DIAGNOSIS — E785 Hyperlipidemia, unspecified: Secondary | ICD-10-CM

## 2017-05-27 DIAGNOSIS — R0600 Dyspnea, unspecified: Secondary | ICD-10-CM

## 2017-05-27 DIAGNOSIS — R0989 Other specified symptoms and signs involving the circulatory and respiratory systems: Secondary | ICD-10-CM

## 2017-05-27 DIAGNOSIS — R202 Paresthesia of skin: Secondary | ICD-10-CM | POA: Diagnosis not present

## 2017-05-27 DIAGNOSIS — Z0001 Encounter for general adult medical examination with abnormal findings: Secondary | ICD-10-CM | POA: Diagnosis not present

## 2017-05-27 DIAGNOSIS — K22719 Barrett's esophagus with dysplasia, unspecified: Secondary | ICD-10-CM

## 2017-05-27 LAB — BASIC METABOLIC PANEL
BUN: 20 mg/dL (ref 6–23)
CHLORIDE: 104 meq/L (ref 96–112)
CO2: 28 mEq/L (ref 19–32)
CREATININE: 1.01 mg/dL (ref 0.40–1.50)
Calcium: 9.8 mg/dL (ref 8.4–10.5)
GFR: 75.44 mL/min (ref >60.00)
GLUCOSE: 65 mg/dL — AB (ref 70–99)
POTASSIUM: 4.3 meq/L (ref 3.5–5.1)
Sodium: 143 mEq/L (ref 135–145)

## 2017-05-27 LAB — VITAMIN B12: VITAMIN B 12: 525 pg/mL (ref 211–911)

## 2017-05-27 LAB — HEMOGLOBIN A1C: HEMOGLOBIN A1C: 5.4 % (ref 4.6–6.5)

## 2017-05-27 LAB — CBC WITH DIFFERENTIAL/PLATELET
BASOS ABS: 0 10*3/uL (ref 0.0–0.1)
BASOS PCT: 0.4 % (ref 0.0–3.0)
Eosinophils Absolute: 0.1 10*3/uL (ref 0.0–0.7)
Eosinophils Relative: 2.4 % (ref 0.0–5.0)
HEMATOCRIT: 47.4 % (ref 39.0–52.0)
HEMOGLOBIN: 15.7 g/dL (ref 13.0–17.0)
LYMPHS PCT: 19.4 % (ref 12.0–46.0)
Lymphs Abs: 1.2 10*3/uL (ref 0.7–4.0)
MCHC: 33.2 g/dL (ref 30.0–36.0)
MCV: 95.8 fl (ref 78.0–100.0)
MONOS PCT: 10.9 % (ref 3.0–12.0)
Monocytes Absolute: 0.7 10*3/uL (ref 0.1–1.0)
NEUTROS ABS: 4.2 10*3/uL (ref 1.4–7.7)
Neutrophils Relative %: 66.9 % (ref 43.0–77.0)
PLATELETS: 208 10*3/uL (ref 150.0–400.0)
RBC: 4.94 Mil/uL (ref 4.22–5.81)
RDW: 13.4 % (ref 11.5–15.5)
WBC: 6.3 10*3/uL (ref 4.0–10.5)

## 2017-05-27 LAB — HEPATIC FUNCTION PANEL
ALK PHOS: 105 U/L (ref 39–117)
ALT: 46 U/L (ref 0–53)
AST: 26 U/L (ref 0–37)
Albumin: 4.8 g/dL (ref 3.5–5.2)
BILIRUBIN TOTAL: 1.3 mg/dL — AB (ref 0.2–1.2)
Bilirubin, Direct: 0.2 mg/dL (ref 0.0–0.3)
Total Protein: 7 g/dL (ref 6.0–8.3)

## 2017-05-27 LAB — LIPID PANEL
CHOL/HDL RATIO: 3
Cholesterol: 220 mg/dL — ABNORMAL HIGH (ref 0–200)
HDL: 67.9 mg/dL (ref >39.00)
LDL Cholesterol: 116 mg/dL — ABNORMAL HIGH (ref 0–99)
NONHDL: 152.38
Triglycerides: 184 mg/dL — ABNORMAL HIGH (ref 0.0–149.0)
VLDL: 36.8 mg/dL (ref 0.0–40.0)

## 2017-05-27 LAB — URINALYSIS
Bilirubin Urine: NEGATIVE
HGB URINE DIPSTICK: NEGATIVE
Ketones, ur: NEGATIVE
Leukocytes, UA: NEGATIVE
NITRITE: NEGATIVE
PH: 7 (ref 5.0–8.0)
Specific Gravity, Urine: 1.015 (ref 1.000–1.030)
TOTAL PROTEIN, URINE-UPE24: NEGATIVE
Urine Glucose: NEGATIVE
Urobilinogen, UA: 0.2 (ref 0.0–1.0)

## 2017-05-27 LAB — URIC ACID: URIC ACID, SERUM: 5.8 mg/dL (ref 4.0–7.8)

## 2017-05-27 LAB — PSA: PSA: 0.72 ng/mL (ref 0.10–4.00)

## 2017-05-27 LAB — SEDIMENTATION RATE: SED RATE: 5 mm/h (ref 0–20)

## 2017-05-27 LAB — CORTISOL: Cortisol, Plasma: 9.4 ug/dL

## 2017-05-27 LAB — TSH: TSH: 0.69 u[IU]/mL (ref 0.35–4.50)

## 2017-05-27 MED ORDER — ZOSTER VAC RECOMB ADJUVANTED 50 MCG/0.5ML IM SUSR: 0.5000 mL | Freq: Once | INTRAMUSCULAR | 1 refills | Status: AC

## 2017-05-27 MED ORDER — TAMSULOSIN HCL 0.4 MG PO CAPS: 0.4000 mg | ORAL_CAPSULE | Freq: Every day | ORAL | 3 refills | Status: DC

## 2017-05-27 NOTE — Assessment & Plan Note (Signed)
Labs

## 2017-05-27 NOTE — Assessment & Plan Note (Signed)

## 2017-05-27 NOTE — Assessment & Plan Note (Signed)
R mild Doppler US

## 2017-05-27 NOTE — Progress Notes (Signed)
Subjective:  Patient ID: Todd Wolf., male    DOB: [birth year]  Age: 82 y.o. MRN: [id number]  CC: No chief complaint on file.   HPI Todd Wolf Food. presents for a well exam. C/o fatigue, OA -  L knee sometimes  Outpatient Medications Prior to Visit  Medication Sig Dispense Refill  . amLODipine (NORVASC) 5 MG tablet Take 1 tablet (5 mg total) by mouth daily. Must keep scheduled appt in Dec for future refills 30 tablet 0  . aspirin EC 325 MG tablet Take 325 mg by mouth daily.    . benazepril (LOTENSIN) 40 MG tablet Take 1 tablet (40 mg total) by mouth daily. Must keep scheduled appt in Dec for future refills 30 tablet 0  . cholecalciferol (VITAMIN D) 1000 UNITS tablet Take 2 tablets (2,000 Units total) by mouth daily. 100 tablet 3  . colesevelam (WELCHOL) 625 MG tablet TAKE 1 TABLET (625 MG TOTAL) BY MOUTH 2 (TWO) TIMES DAILY WITH A MEAL. 60 tablet 5  . dutasteride (AVODART) 0.5 MG capsule Take 1 capsule (0.5 mg total) by mouth daily. Must keep scheduled appt in Dec for future refills 30 capsule 0  . esomeprazole (NEXIUM) 40 MG capsule Take 1 capsule (40 mg total) by mouth daily. Must keep scheduled appt in Dec for future refills 30 capsule 0  . febuxostat (ULORIC) 40 MG tablet Take 1 tablet (40 mg total) by mouth daily. 90 tablet 3   Facility-Administered Medications Prior to Visit  Medication Dose Route Frequency Provider Last Rate Last Dose  . 0.9 %  sodium chloride infusion  500 mL Intravenous Continuous Irene Shipper, MD        ROS Review of Systems  Constitutional: Positive for fatigue. Negative for appetite change and unexpected weight change.  HENT: Negative for congestion, nosebleeds, sneezing, sore throat and trouble swallowing.   Eyes: Negative for itching and visual disturbance.  Respiratory: Negative for cough, shortness of breath and wheezing.   Cardiovascular: Negative for chest pain, palpitations and leg swelling.  Gastrointestinal: Negative for abdominal  distention, blood in stool, diarrhea and nausea.  Genitourinary: Negative for frequency and hematuria.  Musculoskeletal: Negative for back pain, gait problem, joint swelling and neck pain.  Skin: Negative for rash.  Neurological: Negative for dizziness, tremors, speech difficulty and weakness.  Psychiatric/Behavioral: Negative for agitation, dysphoric mood and sleep disturbance. The patient is not nervous/anxious.     Objective:  BP (!) 152/78 (BP Location: Left Arm, Patient Position: Sitting, Cuff Size: Large)   Pulse (!) 59   Temp 97.9 F (36.6 C) (Oral)   Ht 5\' 10"  (1.778 m)   Wt 218 lb (98.9 kg)   SpO2 99%   BMI 31.28 kg/m   BP Readings from Last 3 Encounters:  05/27/17 (!) 152/78  01/16/17 140/82  03/08/16 126/78    Wt Readings from Last 3 Encounters:  05/27/17 218 lb (98.9 kg)  01/16/17 214 lb (97.1 kg)  12/31/16 214 lb 6.4 oz (97.3 kg)    Physical Exam  Constitutional: He is oriented to person, place, and time. He appears well-developed. No distress.  NAD  HENT:  Mouth/Throat: Oropharynx is clear and moist.  Eyes: Conjunctivae are normal. Pupils are equal, round, and reactive to light.  Neck: Normal range of motion. No JVD present. No thyromegaly present.  Cardiovascular: Normal rate, regular rhythm, normal heart sounds and intact distal pulses. Exam reveals no gallop and no friction rub.  No murmur heard. Pulmonary/Chest: Effort normal and  breath sounds normal. No respiratory distress. He has no wheezes. He has no rales. He exhibits no tenderness.  Abdominal: Soft. Bowel sounds are normal. He exhibits no distension and no mass. There is no tenderness. There is no rebound and no guarding.  Musculoskeletal: Normal range of motion. He exhibits no edema or tenderness.  Lymphadenopathy:    He has no cervical adenopathy.  Neurological: He is alert and oriented to person, place, and time. He has normal reflexes. No cranial nerve deficit. He exhibits normal muscle tone.  He displays a negative Romberg sign. Coordination and gait normal.  Skin: Skin is warm and dry. No rash noted.  Psychiatric: He has a normal mood and affect. His behavior is normal. Judgment and thought content normal.  R car bruit Obese  Procedure: EKG Indication: new fatigue, DOE Impression: NSR. No new changes.   Lab Results  Component Value Date   WBC 7.8 03/08/2016   HGB 16.1 03/08/2016   HCT 46.8 03/08/2016   PLT 198.0 03/08/2016   GLUCOSE 84 03/08/2016   CHOL 236 (H) 06/14/2015   TRIG 260.0 (H) 06/14/2015   HDL 60.00 06/14/2015   LDLDIRECT 126.0 06/14/2015   ALT 46 03/08/2016   AST 29 03/08/2016   NA 141 03/08/2016   K 4.5 03/08/2016   CL 104 03/08/2016   CREATININE 1.05 03/08/2016   BUN 19 03/08/2016   CO2 32 03/08/2016   TSH 0.55 03/08/2016   PSA 0.78 06/14/2015   INR 1.0 09/01/2012   HGBA1C 5.4 06/08/2013    Dg Chest 2 View  Result Date: 06/14/2015 CLINICAL DATA:  Cough, hypertension EXAM: CHEST  2 VIEW COMPARISON:  12/04/2011 FINDINGS: Small calcified granuloma in the left base. Linear scarring in the lingula. Right lung is clear. Heart is normal size. No effusions or acute bony abnormality. IMPRESSION: No active cardiopulmonary disease. Electronically Signed   By: Rolm Baptise M.D.   On: 06/14/2015 10:36    Assessment & Plan:   There are no diagnoses linked to this encounter. I am having Todd Wolf. maintain his cholecalciferol, aspirin EC, colesevelam, febuxostat, amLODipine, benazepril, esomeprazole, dutasteride, and tamsulosin. We will continue to administer sodium chloride.  Meds ordered this encounter  Medications  . tamsulosin (FLOMAX) 0.4 MG CAPS capsule    Sig: Take 1 capsule (0.4 mg total) by mouth daily after supper.    Dispense:  90 capsule    Refill:  3     Follow-up: No Follow-up on file.  Todd Kehr, MD

## 2017-05-27 NOTE — Assessment & Plan Note (Signed)
Nexium 

## 2017-05-27 NOTE — Assessment & Plan Note (Signed)
Welchol 

## 2017-05-27 NOTE — Assessment & Plan Note (Signed)
EKG Labs Loose 10 lbs

## 2017-06-12 ENCOUNTER — Other Ambulatory Visit: Payer: Self-pay | Admitting: Internal Medicine

## 2017-06-13 DIAGNOSIS — H2513 Age-related nuclear cataract, bilateral: Secondary | ICD-10-CM | POA: Diagnosis not present

## 2017-06-13 MED ORDER — DUTASTERIDE 0.5 MG PO CAPS: 0.5000 mg | ORAL_CAPSULE | Freq: Every day | ORAL | 3 refills | Status: DC

## 2017-06-13 MED ORDER — BENAZEPRIL HCL 40 MG PO TABS: 40.0000 mg | ORAL_TABLET | Freq: Every day | ORAL | 3 refills | Status: DC

## 2017-06-13 MED ORDER — AMLODIPINE BESYLATE 5 MG PO TABS: 5.0000 mg | ORAL_TABLET | Freq: Every day | ORAL | 3 refills | Status: DC

## 2017-06-13 MED ORDER — ESOMEPRAZOLE MAGNESIUM 40 MG PO CPDR: 40.0000 mg | DELAYED_RELEASE_CAPSULE | Freq: Every day | ORAL | 3 refills | Status: DC

## 2017-06-30 DIAGNOSIS — S40861A Insect bite (nonvenomous) of right upper arm, initial encounter: Secondary | ICD-10-CM | POA: Diagnosis not present

## 2017-06-30 DIAGNOSIS — S41151A Open bite of right upper arm, initial encounter: Secondary | ICD-10-CM | POA: Diagnosis not present

## 2017-06-30 DIAGNOSIS — T63091A Toxic effect of venom of other snake, accidental (unintentional), initial encounter: Secondary | ICD-10-CM | POA: Diagnosis not present

## 2017-06-30 DIAGNOSIS — L03113 Cellulitis of right upper limb: Secondary | ICD-10-CM | POA: Diagnosis not present

## 2017-06-30 DIAGNOSIS — M79601 Pain in right arm: Secondary | ICD-10-CM | POA: Diagnosis not present

## 2017-06-30 DIAGNOSIS — W57XXXA Bitten or stung by nonvenomous insect and other nonvenomous arthropods, initial encounter: Secondary | ICD-10-CM | POA: Diagnosis not present

## 2017-07-16 DIAGNOSIS — M1712 Unilateral primary osteoarthritis, left knee: Secondary | ICD-10-CM | POA: Diagnosis not present

## 2017-07-16 DIAGNOSIS — M25562 Pain in left knee: Secondary | ICD-10-CM | POA: Diagnosis not present

## 2017-08-08 DIAGNOSIS — M25562 Pain in left knee: Secondary | ICD-10-CM | POA: Diagnosis not present

## 2017-08-08 DIAGNOSIS — S83242A Other tear of medial meniscus, current injury, left knee, initial encounter: Secondary | ICD-10-CM | POA: Diagnosis not present

## 2017-08-08 DIAGNOSIS — M1712 Unilateral primary osteoarthritis, left knee: Secondary | ICD-10-CM | POA: Diagnosis not present

## 2017-08-20 ENCOUNTER — Encounter: Payer: Self-pay | Admitting: Internal Medicine

## 2017-09-16 DIAGNOSIS — L814 Other melanin hyperpigmentation: Secondary | ICD-10-CM | POA: Diagnosis not present

## 2017-09-16 DIAGNOSIS — L821 Other seborrheic keratosis: Secondary | ICD-10-CM | POA: Diagnosis not present

## 2017-09-16 DIAGNOSIS — L579 Skin changes due to chronic exposure to nonionizing radiation, unspecified: Secondary | ICD-10-CM | POA: Diagnosis not present

## 2017-09-16 DIAGNOSIS — Z85828 Personal history of other malignant neoplasm of skin: Secondary | ICD-10-CM | POA: Diagnosis not present

## 2017-09-16 DIAGNOSIS — D225 Melanocytic nevi of trunk: Secondary | ICD-10-CM | POA: Diagnosis not present

## 2017-09-19 ENCOUNTER — Other Ambulatory Visit: Payer: Self-pay

## 2017-09-19 MED ORDER — FEBUXOSTAT 40 MG PO TABS: 40.0000 mg | ORAL_TABLET | Freq: Every day | ORAL | 3 refills | Status: DC

## 2017-09-27 DIAGNOSIS — M25562 Pain in left knee: Secondary | ICD-10-CM | POA: Diagnosis not present

## 2017-09-27 DIAGNOSIS — M1712 Unilateral primary osteoarthritis, left knee: Secondary | ICD-10-CM | POA: Diagnosis not present

## 2017-09-27 DIAGNOSIS — M25561 Pain in right knee: Secondary | ICD-10-CM | POA: Diagnosis not present

## 2017-10-09 DIAGNOSIS — M659 Synovitis and tenosynovitis, unspecified: Secondary | ICD-10-CM | POA: Diagnosis not present

## 2017-10-09 DIAGNOSIS — M25462 Effusion, left knee: Secondary | ICD-10-CM | POA: Diagnosis not present

## 2017-10-09 DIAGNOSIS — M7122 Synovial cyst of popliteal space [Baker], left knee: Secondary | ICD-10-CM | POA: Diagnosis not present

## 2017-10-09 DIAGNOSIS — S83232A Complex tear of medial meniscus, current injury, left knee, initial encounter: Secondary | ICD-10-CM | POA: Diagnosis not present

## 2017-10-16 DIAGNOSIS — S83232D Complex tear of medial meniscus, current injury, left knee, subsequent encounter: Secondary | ICD-10-CM | POA: Diagnosis not present

## 2017-10-24 DIAGNOSIS — M65861 Other synovitis and tenosynovitis, right lower leg: Secondary | ICD-10-CM | POA: Diagnosis not present

## 2017-10-24 DIAGNOSIS — M109 Gout, unspecified: Secondary | ICD-10-CM | POA: Diagnosis not present

## 2017-10-24 DIAGNOSIS — M65862 Other synovitis and tenosynovitis, left lower leg: Secondary | ICD-10-CM | POA: Diagnosis not present

## 2017-10-24 DIAGNOSIS — I10 Essential (primary) hypertension: Secondary | ICD-10-CM | POA: Diagnosis not present

## 2017-10-24 DIAGNOSIS — E785 Hyperlipidemia, unspecified: Secondary | ICD-10-CM | POA: Diagnosis not present

## 2017-10-24 DIAGNOSIS — M659 Synovitis and tenosynovitis, unspecified: Secondary | ICD-10-CM | POA: Diagnosis not present

## 2017-10-24 DIAGNOSIS — K2270 Barrett's esophagus without dysplasia: Secondary | ICD-10-CM | POA: Diagnosis not present

## 2017-10-24 DIAGNOSIS — N400 Enlarged prostate without lower urinary tract symptoms: Secondary | ICD-10-CM | POA: Diagnosis not present

## 2017-10-24 DIAGNOSIS — Z79899 Other long term (current) drug therapy: Secondary | ICD-10-CM | POA: Diagnosis not present

## 2017-10-24 DIAGNOSIS — K219 Gastro-esophageal reflux disease without esophagitis: Secondary | ICD-10-CM | POA: Diagnosis not present

## 2017-10-24 DIAGNOSIS — K8681 Exocrine pancreatic insufficiency: Secondary | ICD-10-CM | POA: Diagnosis not present

## 2017-10-24 DIAGNOSIS — S83232A Complex tear of medial meniscus, current injury, left knee, initial encounter: Secondary | ICD-10-CM | POA: Diagnosis not present

## 2017-10-24 DIAGNOSIS — M94262 Chondromalacia, left knee: Secondary | ICD-10-CM | POA: Diagnosis not present

## 2017-10-24 DIAGNOSIS — K861 Other chronic pancreatitis: Secondary | ICD-10-CM | POA: Diagnosis not present

## 2017-10-24 DIAGNOSIS — M2242 Chondromalacia patellae, left knee: Secondary | ICD-10-CM | POA: Diagnosis not present

## 2017-11-04 DIAGNOSIS — Z4789 Encounter for other orthopedic aftercare: Secondary | ICD-10-CM | POA: Diagnosis not present

## 2017-11-04 DIAGNOSIS — M79662 Pain in left lower leg: Secondary | ICD-10-CM | POA: Diagnosis not present

## 2017-11-06 DIAGNOSIS — Z4789 Encounter for other orthopedic aftercare: Secondary | ICD-10-CM | POA: Diagnosis not present

## 2017-12-05 DIAGNOSIS — Z4789 Encounter for other orthopedic aftercare: Secondary | ICD-10-CM | POA: Diagnosis not present

## 2017-12-17 ENCOUNTER — Encounter: Payer: Self-pay | Admitting: Internal Medicine

## 2017-12-17 ENCOUNTER — Telehealth: Payer: Self-pay | Admitting: Internal Medicine

## 2017-12-17 ENCOUNTER — Ambulatory Visit: Payer: Medicare Other | Admitting: Internal Medicine

## 2017-12-17 NOTE — Telephone Encounter (Signed)
Copied from [place] [phone]. Topic: Quick Communication - See Telephone Encounter >> Dec 17, 2017 10:04 AM Vernona Rieger wrote: CRM for notification. See Telephone encounter for: 12/17/17.  The specialist clinic at [facility] called and said he will be see Dr Early Osmond tomorrow @ 10:40 am and they need any last few office visit notes and recent lab work sent to fax number [phone] Attention Dr Early Osmond

## 2017-12-18 DIAGNOSIS — E663 Overweight: Secondary | ICD-10-CM | POA: Diagnosis not present

## 2017-12-18 DIAGNOSIS — R635 Abnormal weight gain: Secondary | ICD-10-CM | POA: Diagnosis not present

## 2017-12-18 DIAGNOSIS — R351 Nocturia: Secondary | ICD-10-CM | POA: Diagnosis not present

## 2017-12-18 DIAGNOSIS — R5383 Other fatigue: Secondary | ICD-10-CM | POA: Diagnosis not present

## 2017-12-18 DIAGNOSIS — G4751 Confusional arousals: Secondary | ICD-10-CM | POA: Diagnosis not present

## 2017-12-18 DIAGNOSIS — R51 Headache: Secondary | ICD-10-CM | POA: Diagnosis not present

## 2017-12-18 DIAGNOSIS — Z9189 Other specified personal risk factors, not elsewhere classified: Secondary | ICD-10-CM | POA: Diagnosis not present

## 2017-12-18 DIAGNOSIS — Z6832 Body mass index (BMI) 32.0-32.9, adult: Secondary | ICD-10-CM | POA: Diagnosis not present

## 2017-12-18 DIAGNOSIS — R0683 Snoring: Secondary | ICD-10-CM | POA: Diagnosis not present

## 2017-12-18 DIAGNOSIS — I10 Essential (primary) hypertension: Secondary | ICD-10-CM | POA: Diagnosis not present

## 2017-12-18 DIAGNOSIS — R42 Dizziness and giddiness: Secondary | ICD-10-CM | POA: Diagnosis not present

## 2017-12-18 DIAGNOSIS — E162 Hypoglycemia, unspecified: Secondary | ICD-10-CM | POA: Diagnosis not present

## 2017-12-18 NOTE — Telephone Encounter (Signed)
See my chart encounter.

## 2017-12-20 DIAGNOSIS — Z6830 Body mass index (BMI) 30.0-30.9, adult: Secondary | ICD-10-CM | POA: Diagnosis not present

## 2017-12-20 DIAGNOSIS — I10 Essential (primary) hypertension: Secondary | ICD-10-CM | POA: Diagnosis not present

## 2017-12-20 DIAGNOSIS — G4733 Obstructive sleep apnea (adult) (pediatric): Secondary | ICD-10-CM | POA: Diagnosis not present

## 2017-12-20 DIAGNOSIS — R0602 Shortness of breath: Secondary | ICD-10-CM | POA: Diagnosis not present

## 2017-12-20 DIAGNOSIS — E669 Obesity, unspecified: Secondary | ICD-10-CM | POA: Diagnosis not present

## 2017-12-20 DIAGNOSIS — R0609 Other forms of dyspnea: Secondary | ICD-10-CM | POA: Diagnosis not present

## 2017-12-21 DIAGNOSIS — G4733 Obstructive sleep apnea (adult) (pediatric): Secondary | ICD-10-CM | POA: Diagnosis not present

## 2017-12-21 DIAGNOSIS — E669 Obesity, unspecified: Secondary | ICD-10-CM | POA: Diagnosis not present

## 2017-12-21 DIAGNOSIS — I10 Essential (primary) hypertension: Secondary | ICD-10-CM | POA: Diagnosis not present

## 2017-12-23 DIAGNOSIS — K22719 Barrett's esophagus with dysplasia, unspecified: Secondary | ICD-10-CM | POA: Diagnosis not present

## 2017-12-23 DIAGNOSIS — Z79899 Other long term (current) drug therapy: Secondary | ICD-10-CM | POA: Diagnosis not present

## 2017-12-23 DIAGNOSIS — R0600 Dyspnea, unspecified: Secondary | ICD-10-CM | POA: Diagnosis not present

## 2017-12-23 DIAGNOSIS — G4736 Sleep related hypoventilation in conditions classified elsewhere: Secondary | ICD-10-CM | POA: Diagnosis not present

## 2017-12-23 DIAGNOSIS — G4733 Obstructive sleep apnea (adult) (pediatric): Secondary | ICD-10-CM | POA: Diagnosis not present

## 2017-12-23 DIAGNOSIS — R0602 Shortness of breath: Secondary | ICD-10-CM | POA: Diagnosis not present

## 2017-12-24 DIAGNOSIS — R918 Other nonspecific abnormal finding of lung field: Secondary | ICD-10-CM | POA: Diagnosis not present

## 2017-12-24 DIAGNOSIS — R0602 Shortness of breath: Secondary | ICD-10-CM | POA: Diagnosis not present

## 2017-12-24 DIAGNOSIS — R911 Solitary pulmonary nodule: Secondary | ICD-10-CM | POA: Diagnosis not present

## 2017-12-25 DIAGNOSIS — I10 Essential (primary) hypertension: Secondary | ICD-10-CM | POA: Diagnosis not present

## 2017-12-25 DIAGNOSIS — R911 Solitary pulmonary nodule: Secondary | ICD-10-CM | POA: Diagnosis not present

## 2017-12-25 DIAGNOSIS — Z79899 Other long term (current) drug therapy: Secondary | ICD-10-CM | POA: Diagnosis not present

## 2017-12-25 DIAGNOSIS — G4733 Obstructive sleep apnea (adult) (pediatric): Secondary | ICD-10-CM | POA: Diagnosis not present

## 2017-12-25 DIAGNOSIS — E785 Hyperlipidemia, unspecified: Secondary | ICD-10-CM | POA: Diagnosis not present

## 2017-12-27 ENCOUNTER — Telehealth: Payer: Self-pay | Admitting: *Deleted

## 2017-12-27 NOTE — Telephone Encounter (Signed)
Uloric is denied. Pt's plan prefers allopurinol. Please advise.

## 2017-12-27 NOTE — Telephone Encounter (Signed)
pls send a PA _ Allopurinol was ineffective Thx

## 2017-12-30 NOTE — Telephone Encounter (Signed)
Completed PA form for Uloric faxed to [facility].

## 2018-01-08 DIAGNOSIS — M25562 Pain in left knee: Secondary | ICD-10-CM | POA: Diagnosis not present

## 2018-01-08 DIAGNOSIS — Z9889 Other specified postprocedural states: Secondary | ICD-10-CM | POA: Diagnosis not present

## 2018-01-08 DIAGNOSIS — Z4789 Encounter for other orthopedic aftercare: Secondary | ICD-10-CM | POA: Diagnosis not present

## 2018-01-09 DIAGNOSIS — H938X1 Other specified disorders of right ear: Secondary | ICD-10-CM | POA: Diagnosis not present

## 2018-01-16 DIAGNOSIS — M25562 Pain in left knee: Secondary | ICD-10-CM | POA: Diagnosis not present

## 2018-01-16 DIAGNOSIS — G8929 Other chronic pain: Secondary | ICD-10-CM | POA: Diagnosis not present

## 2018-01-16 DIAGNOSIS — M1712 Unilateral primary osteoarthritis, left knee: Secondary | ICD-10-CM | POA: Diagnosis not present

## 2018-01-21 DIAGNOSIS — R911 Solitary pulmonary nodule: Secondary | ICD-10-CM | POA: Diagnosis not present

## 2018-01-21 DIAGNOSIS — R918 Other nonspecific abnormal finding of lung field: Secondary | ICD-10-CM | POA: Diagnosis not present

## 2018-01-24 DIAGNOSIS — Z9989 Dependence on other enabling machines and devices: Secondary | ICD-10-CM | POA: Diagnosis not present

## 2018-01-24 DIAGNOSIS — R0902 Hypoxemia: Secondary | ICD-10-CM | POA: Diagnosis not present

## 2018-01-24 DIAGNOSIS — G4733 Obstructive sleep apnea (adult) (pediatric): Secondary | ICD-10-CM | POA: Diagnosis not present

## 2018-01-24 DIAGNOSIS — R911 Solitary pulmonary nodule: Secondary | ICD-10-CM | POA: Diagnosis not present

## 2018-03-12 ENCOUNTER — Other Ambulatory Visit: Payer: Self-pay | Admitting: Internal Medicine

## 2018-03-12 MED ORDER — SCOPOLAMINE 1 MG/3DAYS TD PT72: 1.0000 | MEDICATED_PATCH | TRANSDERMAL | 0 refills | Status: AC

## 2018-04-14 ENCOUNTER — Encounter: Payer: Self-pay | Admitting: Internal Medicine

## 2018-04-14 ENCOUNTER — Other Ambulatory Visit: Payer: Medicare Other

## 2018-04-14 ENCOUNTER — Ambulatory Visit (INDEPENDENT_AMBULATORY_CARE_PROVIDER_SITE_OTHER): Payer: Medicare Other | Admitting: Internal Medicine

## 2018-04-14 ENCOUNTER — Ambulatory Visit (INDEPENDENT_AMBULATORY_CARE_PROVIDER_SITE_OTHER)
Admission: RE | Admit: 2018-04-14 | Discharge: 2018-04-14 | Disposition: A | Discharge status: Home / Self Care | Payer: Medicare Other | Source: Ambulatory Visit | Attending: Internal Medicine | Admitting: Internal Medicine

## 2018-04-14 ENCOUNTER — Other Ambulatory Visit (INDEPENDENT_AMBULATORY_CARE_PROVIDER_SITE_OTHER): Payer: Medicare Other

## 2018-04-14 VITALS — BP 142/82 | HR 62 | Temp 98.0°F | Ht 70.0 in | Wt 220.0 lb

## 2018-04-14 DIAGNOSIS — R05 Cough: Secondary | ICD-10-CM | POA: Diagnosis not present

## 2018-04-14 DIAGNOSIS — R059 Cough, unspecified: Secondary | ICD-10-CM

## 2018-04-14 DIAGNOSIS — R112 Nausea with vomiting, unspecified: Secondary | ICD-10-CM

## 2018-04-14 DIAGNOSIS — R5383 Other fatigue: Secondary | ICD-10-CM | POA: Diagnosis not present

## 2018-04-14 LAB — CBC WITH DIFFERENTIAL/PLATELET
BASOS PCT: 0.5 % (ref 0.0–3.0)
Basophils Absolute: 0 10*3/uL (ref 0.0–0.1)
EOS PCT: 2.4 % (ref 0.0–5.0)
Eosinophils Absolute: 0.2 10*3/uL (ref 0.0–0.7)
HCT: 46.7 % (ref 39.0–52.0)
Hemoglobin: 16 g/dL (ref 13.0–17.0)
LYMPHS ABS: 1.5 10*3/uL (ref 0.7–4.0)
Lymphocytes Relative: 20.4 % (ref 12.0–46.0)
MCHC: 34.2 g/dL (ref 30.0–36.0)
MCV: 94.2 fl (ref 78.0–100.0)
MONOS PCT: 9.8 % (ref 3.0–12.0)
Monocytes Absolute: 0.7 10*3/uL (ref 0.1–1.0)
NEUTROS ABS: 5.1 10*3/uL (ref 1.4–7.7)
Neutrophils Relative %: 66.9 % (ref 43.0–77.0)
PLATELETS: 205 10*3/uL (ref 150.0–400.0)
RBC: 4.96 Mil/uL (ref 4.22–5.81)
RDW: 13.5 % (ref 11.5–15.5)
WBC: 7.6 10*3/uL (ref 4.0–10.5)

## 2018-04-14 LAB — BASIC METABOLIC PANEL
BUN: 19 mg/dL (ref 6–23)
CO2: 30 mEq/L (ref 19–32)
CREATININE: 0.99 mg/dL (ref 0.40–1.50)
Calcium: 9.6 mg/dL (ref 8.4–10.5)
Chloride: 102 mEq/L (ref 96–112)
GFR: 77.03 mL/min (ref >60.00)
Glucose, Bld: 86 mg/dL (ref 70–99)
Potassium: 4.6 mEq/L (ref 3.5–5.1)
Sodium: 139 mEq/L (ref 135–145)

## 2018-04-14 LAB — HEPATIC FUNCTION PANEL
ALK PHOS: 125 U/L — AB (ref 39–117)
ALT: 46 U/L (ref 0–53)
AST: 28 U/L (ref 0–37)
Albumin: 4.7 g/dL (ref 3.5–5.2)
BILIRUBIN DIRECT: 0.3 mg/dL (ref 0.0–0.3)
TOTAL PROTEIN: 7.4 g/dL (ref 6.0–8.3)
Total Bilirubin: 1.4 mg/dL — ABNORMAL HIGH (ref 0.2–1.2)

## 2018-04-14 LAB — URINALYSIS
BILIRUBIN URINE: NEGATIVE
HGB URINE DIPSTICK: NEGATIVE
Ketones, ur: NEGATIVE
LEUKOCYTES UA: NEGATIVE
NITRITE: NEGATIVE
PH: 6 (ref 5.0–8.0)
Specific Gravity, Urine: 1.01 (ref 1.000–1.030)
Total Protein, Urine: NEGATIVE
Urine Glucose: NEGATIVE
Urobilinogen, UA: 0.2 (ref 0.0–1.0)

## 2018-04-14 MED ORDER — CEFDINIR 300 MG PO CAPS: 300.0000 mg | ORAL_CAPSULE | Freq: Two times a day (BID) | ORAL | 0 refills | Status: AC

## 2018-04-14 MED ORDER — PROMETHAZINE-CODEINE 6.25-10 MG/5ML PO SYRP: 5.0000 mL | ORAL_SOLUTION | ORAL | 0 refills | Status: AC | PRN

## 2018-04-14 NOTE — Patient Instructions (Signed)
You can use over-the-counter  "cold" medicines  such as  "Afrin" nasal spray for nasal congestion as directed. Use " Delsym" or" Robitussin" cough syrup varietis for cough.  You can use plain "Tylenol" or "Advil" for fever, chills and achyness. Use Halls or Ricola cough drops.     Please, make an appointment if you are not better or if you're worse.  

## 2018-04-14 NOTE — Progress Notes (Signed)
Subjective:  Patient ID: Todd Lank., male    DOB: [birth year]  Age: 83 y.o. MRN: [id number]  CC: No chief complaint on file.   HPI Todd Theodoro Kalata. presents for URI sx's x 1.5 weeks. C/o fatigue, nausea, cough. He came back from a [place] cruise: 3 out of 10 people were sick w/similar sx's Finished a [employer] - not better  Outpatient Medications Prior to Visit  Medication Sig Dispense Refill  . amLODipine (NORVASC) 5 MG tablet Take 1 tablet (5 mg total) by mouth daily. 90 tablet 3  . aspirin EC 81 MG tablet Take 81 mg by mouth daily.     . benazepril (LOTENSIN) 40 MG tablet Take 1 tablet (40 mg total) by mouth daily. 90 tablet 3  . cholecalciferol (VITAMIN D) 1000 UNITS tablet Take 2 tablets (2,000 Units total) by mouth daily. 100 tablet 3  . colesevelam (WELCHOL) 625 MG tablet TAKE 1 TABLET (625 MG TOTAL) BY MOUTH 2 (TWO) TIMES DAILY WITH A MEAL. 60 tablet 5  . dutasteride (AVODART) 0.5 MG capsule Take 1 capsule (0.5 mg total) by mouth daily. 90 capsule 3  . esomeprazole (NEXIUM) 40 MG capsule Take 1 capsule (40 mg total) by mouth daily. 90 capsule 3  . febuxostat (ULORIC) 40 MG tablet Take 1 tablet (40 mg total) by mouth daily. 90 tablet 3  . scopolamine (TRANSDERM-SCOP, 1.5 MG,) 1 MG/3DAYS Place 1 patch (1.5 mg total) onto the skin every 3 (three) days. 8 patch 0  . tamsulosin (FLOMAX) 0.4 MG CAPS capsule Take 1 capsule (0.4 mg total) by mouth daily after supper. 90 capsule 3   No facility-administered medications prior to visit.     ROS: Review of Systems  Constitutional: Positive for fatigue. Negative for appetite change, fever and unexpected weight change.  HENT: Negative for congestion, nosebleeds, sneezing, sore throat and trouble swallowing.   Eyes: Negative for itching and visual disturbance.  Respiratory: Negative for cough, chest tightness, shortness of breath, wheezing and stridor.   Cardiovascular: Negative for chest pain, palpitations and leg swelling.    Gastrointestinal: Positive for nausea. Negative for abdominal distention, abdominal pain, blood in stool, diarrhea and vomiting.  Genitourinary: Negative for frequency and hematuria.  Musculoskeletal: Negative for back pain, gait problem, joint swelling, neck pain and neck stiffness.  Skin: Negative for rash.  Neurological: Positive for weakness. Negative for dizziness, tremors and speech difficulty.  Psychiatric/Behavioral: Negative for agitation, dysphoric mood and sleep disturbance. The patient is not nervous/anxious.     Objective:  BP (!) 142/82 (BP Location: Left Arm, Patient Position: Sitting, Cuff Size: Large)   Pulse 62   Temp 98 F (36.7 C) (Oral)   Ht 5\' 10"  (1.778 m)   Wt 220 lb (99.8 kg)   SpO2 96%   BMI 31.57 kg/m   BP Readings from Last 3 Encounters:  04/14/18 (!) 142/82  05/27/17 (!) 152/78  01/16/17 140/82    Wt Readings from Last 3 Encounters:  04/14/18 220 lb (99.8 kg)  05/27/17 218 lb (98.9 kg)  01/16/17 214 lb (97.1 kg)    Physical Exam  Constitutional: He is oriented to person, place, and time. He appears well-developed. No distress.  NAD  HENT:  Mouth/Throat: Oropharynx is clear and moist.  Eyes: Pupils are equal, round, and reactive to light. Conjunctivae are normal.  Neck: Normal range of motion. No JVD present. No thyromegaly present.  Cardiovascular: Normal rate, regular rhythm, normal heart sounds and intact distal pulses. Exam reveals  no gallop and no friction rub.  No murmur heard. Pulmonary/Chest: Effort normal and breath sounds normal. No respiratory distress. He has no wheezes. He has no rales. He exhibits no tenderness.  Abdominal: Soft. Bowel sounds are normal. He exhibits no distension and no mass. There is no tenderness. There is no rebound and no guarding.  Musculoskeletal: Normal range of motion. He exhibits no edema or tenderness.  Lymphadenopathy:    He has no cervical adenopathy.  Neurological: He is alert and oriented to  person, place, and time. He has normal reflexes. No cranial nerve deficit. He exhibits normal muscle tone. He displays a negative Romberg sign. Coordination and gait normal.  Skin: Skin is warm and dry. No rash noted.  Psychiatric: He has a normal mood and affect. His behavior is normal. Judgment and thought content normal.  swollen nasal mucosa Calves NT < than trace B edema  Lab Results  Component Value Date   WBC 6.3 05/27/2017   HGB 15.7 05/27/2017   HCT 47.4 05/27/2017   PLT 208.0 05/27/2017   GLUCOSE 65 (L) 05/27/2017   CHOL 220 (H) 05/27/2017   TRIG 184.0 (H) 05/27/2017   HDL 67.90 05/27/2017   LDLDIRECT 126.0 06/14/2015   LDLCALC 116 (H) 05/27/2017   ALT 46 05/27/2017   AST 26 05/27/2017   NA 143 05/27/2017   K 4.3 05/27/2017   CL 104 05/27/2017   CREATININE 1.01 05/27/2017   BUN 20 05/27/2017   CO2 28 05/27/2017   TSH 0.69 05/27/2017   PSA 0.72 05/27/2017   INR 1.0 09/01/2012   HGBA1C 5.4 05/27/2017    Dg Chest 2 View  Result Date: 05/27/2017 CLINICAL DATA:  Two weeks of dyspnea on exertion with no other complaints. History of hypertension, gastroesophageal reflux, never smoked. EXAM: CHEST  2 VIEW COMPARISON:  PA and lateral chest x-ray of June 14, 2015 FINDINGS: The lungs are adequately inflated. There is no focal infiltrate. There is no pleural effusion. The heart and pulmonary vascularity are normal. There is calcification in the wall of the aortic arch. A stable calcified nodule is present in the lingula. The bony thorax exhibits no acute abnormality. IMPRESSION: There is no pneumonia, CHF, nor other acute cardiopulmonary abnormality. Electronically Signed   By: David  Martinique M.D.   On: 05/27/2017 11:16    Assessment & Plan:   There are no diagnoses linked to this encounter.   No orders of the defined types were placed in this encounter.    Follow-up: No follow-ups on file.  [name], MD

## 2018-04-14 NOTE — Assessment & Plan Note (Signed)
Prom-cod syr prn  Labs

## 2018-04-14 NOTE — Assessment & Plan Note (Signed)
Cefdinir Prom-cod syr prn CXR Labs w/D dimer

## 2018-04-15 LAB — D-DIMER, QUANTITATIVE (NOT AT ARMC): D DIMER QUANT: 0.35 ug{FEU}/mL (ref <0.50)

## 2018-04-23 DIAGNOSIS — D225 Melanocytic nevi of trunk: Secondary | ICD-10-CM | POA: Diagnosis not present

## 2018-04-23 DIAGNOSIS — L814 Other melanin hyperpigmentation: Secondary | ICD-10-CM | POA: Diagnosis not present

## 2018-04-23 DIAGNOSIS — L821 Other seborrheic keratosis: Secondary | ICD-10-CM | POA: Diagnosis not present

## 2018-04-23 DIAGNOSIS — D485 Neoplasm of uncertain behavior of skin: Secondary | ICD-10-CM | POA: Diagnosis not present

## 2018-04-23 DIAGNOSIS — L720 Epidermal cyst: Secondary | ICD-10-CM | POA: Diagnosis not present

## 2018-04-23 DIAGNOSIS — C44319 Basal cell carcinoma of skin of other parts of face: Secondary | ICD-10-CM | POA: Diagnosis not present

## 2018-04-23 DIAGNOSIS — Z85828 Personal history of other malignant neoplasm of skin: Secondary | ICD-10-CM | POA: Diagnosis not present

## 2018-04-23 DIAGNOSIS — L570 Actinic keratosis: Secondary | ICD-10-CM | POA: Diagnosis not present

## 2018-04-23 DIAGNOSIS — L579 Skin changes due to chronic exposure to nonionizing radiation, unspecified: Secondary | ICD-10-CM | POA: Diagnosis not present

## 2018-04-30 DIAGNOSIS — C44319 Basal cell carcinoma of skin of other parts of face: Secondary | ICD-10-CM | POA: Diagnosis not present

## 2018-05-20 DIAGNOSIS — M1712 Unilateral primary osteoarthritis, left knee: Secondary | ICD-10-CM | POA: Diagnosis not present

## 2018-05-20 DIAGNOSIS — G8929 Other chronic pain: Secondary | ICD-10-CM | POA: Diagnosis not present

## 2018-05-20 DIAGNOSIS — M25562 Pain in left knee: Secondary | ICD-10-CM | POA: Diagnosis not present

## 2018-05-20 DIAGNOSIS — M25662 Stiffness of left knee, not elsewhere classified: Secondary | ICD-10-CM | POA: Diagnosis not present

## 2018-06-02 DIAGNOSIS — C44319 Basal cell carcinoma of skin of other parts of face: Secondary | ICD-10-CM | POA: Diagnosis not present

## 2018-06-27 ENCOUNTER — Other Ambulatory Visit: Payer: Self-pay

## 2018-06-27 MED ORDER — DUTASTERIDE 0.5 MG PO CAPS: 0.5000 mg | ORAL_CAPSULE | Freq: Every day | ORAL | 3 refills | Status: DC

## 2018-06-27 MED ORDER — AMLODIPINE BESYLATE 5 MG PO TABS: 5.0000 mg | ORAL_TABLET | Freq: Every day | ORAL | 3 refills | Status: DC

## 2018-06-27 MED ORDER — ESOMEPRAZOLE MAGNESIUM 40 MG PO CPDR: 40.0000 mg | DELAYED_RELEASE_CAPSULE | Freq: Every day | ORAL | 3 refills | Status: DC

## 2018-07-01 DIAGNOSIS — G4733 Obstructive sleep apnea (adult) (pediatric): Secondary | ICD-10-CM | POA: Diagnosis not present

## 2018-07-01 DIAGNOSIS — I10 Essential (primary) hypertension: Secondary | ICD-10-CM | POA: Diagnosis not present

## 2018-07-01 DIAGNOSIS — K219 Gastro-esophageal reflux disease without esophagitis: Secondary | ICD-10-CM | POA: Diagnosis not present

## 2018-07-01 DIAGNOSIS — R911 Solitary pulmonary nodule: Secondary | ICD-10-CM | POA: Diagnosis not present

## 2018-07-04 MED ORDER — TAMSULOSIN HCL 0.4 MG PO CAPS: 0.4000 mg | ORAL_CAPSULE | Freq: Every day | ORAL | 3 refills | Status: DC

## 2018-07-21 ENCOUNTER — Other Ambulatory Visit: Payer: Self-pay | Admitting: Internal Medicine

## 2018-07-21 MED ORDER — COLESEVELAM HCL 625 MG PO TABS: ORAL_TABLET | ORAL | 3 refills | Status: DC

## 2018-07-21 MED ORDER — FEBUXOSTAT 40 MG PO TABS: 40.0000 mg | ORAL_TABLET | Freq: Every day | ORAL | 3 refills | Status: DC

## 2018-07-22 ENCOUNTER — Other Ambulatory Visit: Payer: Self-pay | Admitting: Internal Medicine

## 2018-07-22 MED ORDER — COLESTIPOL HCL 1 G PO TABS: 2.0000 g | ORAL_TABLET | Freq: Two times a day (BID) | ORAL | 11 refills | Status: DC

## 2018-07-22 NOTE — Telephone Encounter (Signed)
Pt's assistant calling in.  States that Dr. Alain Marion to review MyChart message sent yesterday because the Colesevelam isn't covered under pt's insurance anymore.

## 2018-07-22 NOTE — Telephone Encounter (Signed)
Copied from [place] [phone]. Topic: Quick Communication - Rx Refill/Question >> Jul 22, 2018  9:31 AM Scherrie Gerlach wrote: Medication: colesevelam [facility]) 625 MG tablet  [facility] calling because they have received this request several times, but it needs a prior authorization.  It will not go through. [facility] [place], [place] - [address] [phone] (Phone) [phone] (Fax)

## 2018-07-23 NOTE — Telephone Encounter (Signed)
Completed PA on cover-my-meds (KEY # [id number]). Received msg stating  Your information has been submitted to [place]. [employer] [place] will review the request and notify you of the determination decision directly, typically within 3 business days of your submission and once all necessary information is received.  You will also receive your request decision electronically. To check for an update later, open the request again from your dashboard.

## 2018-07-24 NOTE — Telephone Encounter (Signed)
PA was cancel.. MD inform pt to try colestipol. Try 1-2 tabs once or twice a day. Rx was [facility] to [facility] Kitchen[facility]y Chess

## 2018-08-18 DIAGNOSIS — H2513 Age-related nuclear cataract, bilateral: Secondary | ICD-10-CM | POA: Diagnosis not present

## 2018-09-03 ENCOUNTER — Other Ambulatory Visit: Payer: Self-pay | Admitting: Internal Medicine

## 2018-09-03 MED ORDER — ALLOPURINOL 100 MG PO TABS: 100.0000 mg | ORAL_TABLET | Freq: Every day | ORAL | 3 refills | Status: DC

## 2018-09-11 ENCOUNTER — Other Ambulatory Visit: Payer: Self-pay | Admitting: Internal Medicine

## 2018-11-03 DIAGNOSIS — R5383 Other fatigue: Secondary | ICD-10-CM | POA: Diagnosis not present

## 2018-11-03 DIAGNOSIS — R911 Solitary pulmonary nodule: Secondary | ICD-10-CM | POA: Diagnosis not present

## 2018-11-03 DIAGNOSIS — Z0000 Encounter for general adult medical examination without abnormal findings: Secondary | ICD-10-CM | POA: Diagnosis not present

## 2018-11-03 DIAGNOSIS — Z125 Encounter for screening for malignant neoplasm of prostate: Secondary | ICD-10-CM | POA: Diagnosis not present

## 2018-11-03 DIAGNOSIS — Z79899 Other long term (current) drug therapy: Secondary | ICD-10-CM | POA: Diagnosis not present

## 2018-11-03 DIAGNOSIS — I499 Cardiac arrhythmia, unspecified: Secondary | ICD-10-CM | POA: Diagnosis not present

## 2018-12-08 DIAGNOSIS — L814 Other melanin hyperpigmentation: Secondary | ICD-10-CM | POA: Diagnosis not present

## 2018-12-08 DIAGNOSIS — L821 Other seborrheic keratosis: Secondary | ICD-10-CM | POA: Diagnosis not present

## 2018-12-08 DIAGNOSIS — L579 Skin changes due to chronic exposure to nonionizing radiation, unspecified: Secondary | ICD-10-CM | POA: Diagnosis not present

## 2018-12-08 DIAGNOSIS — Z85828 Personal history of other malignant neoplasm of skin: Secondary | ICD-10-CM | POA: Diagnosis not present

## 2018-12-18 DIAGNOSIS — M25561 Pain in right knee: Secondary | ICD-10-CM | POA: Diagnosis not present

## 2018-12-18 DIAGNOSIS — M7050 Other bursitis of knee, unspecified knee: Secondary | ICD-10-CM | POA: Diagnosis not present

## 2018-12-18 DIAGNOSIS — M1991 Primary osteoarthritis, unspecified site: Secondary | ICD-10-CM | POA: Diagnosis not present

## 2018-12-18 DIAGNOSIS — M1711 Unilateral primary osteoarthritis, right knee: Secondary | ICD-10-CM | POA: Diagnosis not present

## 2018-12-18 DIAGNOSIS — M25661 Stiffness of right knee, not elsewhere classified: Secondary | ICD-10-CM | POA: Diagnosis not present

## 2018-12-18 DIAGNOSIS — M25461 Effusion, right knee: Secondary | ICD-10-CM | POA: Diagnosis not present

## 2019-01-06 DIAGNOSIS — Z955 Presence of coronary angioplasty implant and graft: Secondary | ICD-10-CM | POA: Diagnosis not present

## 2019-01-06 DIAGNOSIS — K449 Diaphragmatic hernia without obstruction or gangrene: Secondary | ICD-10-CM | POA: Diagnosis not present

## 2019-01-06 DIAGNOSIS — N400 Enlarged prostate without lower urinary tract symptoms: Secondary | ICD-10-CM | POA: Diagnosis not present

## 2019-01-06 DIAGNOSIS — I700 Atherosclerosis of aorta: Secondary | ICD-10-CM | POA: Diagnosis not present

## 2019-01-06 DIAGNOSIS — K219 Gastro-esophageal reflux disease without esophagitis: Secondary | ICD-10-CM | POA: Diagnosis not present

## 2019-01-06 DIAGNOSIS — R911 Solitary pulmonary nodule: Secondary | ICD-10-CM | POA: Diagnosis not present

## 2019-01-06 DIAGNOSIS — Z9049 Acquired absence of other specified parts of digestive tract: Secondary | ICD-10-CM | POA: Diagnosis not present

## 2019-01-06 DIAGNOSIS — I2510 Atherosclerotic heart disease of native coronary artery without angina pectoris: Secondary | ICD-10-CM | POA: Diagnosis not present

## 2019-01-06 DIAGNOSIS — G4733 Obstructive sleep apnea (adult) (pediatric): Secondary | ICD-10-CM | POA: Diagnosis not present

## 2019-01-06 DIAGNOSIS — I10 Essential (primary) hypertension: Secondary | ICD-10-CM | POA: Diagnosis not present

## 2019-01-15 DIAGNOSIS — G8929 Other chronic pain: Secondary | ICD-10-CM | POA: Diagnosis not present

## 2019-01-15 DIAGNOSIS — M25661 Stiffness of right knee, not elsewhere classified: Secondary | ICD-10-CM | POA: Diagnosis not present

## 2019-01-15 DIAGNOSIS — M1711 Unilateral primary osteoarthritis, right knee: Secondary | ICD-10-CM | POA: Diagnosis not present

## 2019-01-15 DIAGNOSIS — M25561 Pain in right knee: Secondary | ICD-10-CM | POA: Diagnosis not present

## 2019-01-20 DIAGNOSIS — N400 Enlarged prostate without lower urinary tract symptoms: Secondary | ICD-10-CM | POA: Diagnosis not present

## 2019-01-23 ENCOUNTER — Other Ambulatory Visit: Payer: Self-pay

## 2019-01-23 DIAGNOSIS — M1711 Unilateral primary osteoarthritis, right knee: Secondary | ICD-10-CM | POA: Diagnosis not present

## 2019-01-23 DIAGNOSIS — M2341 Loose body in knee, right knee: Secondary | ICD-10-CM | POA: Diagnosis not present

## 2019-01-23 DIAGNOSIS — M25561 Pain in right knee: Secondary | ICD-10-CM | POA: Diagnosis not present

## 2019-03-09 DIAGNOSIS — H838X3 Other specified diseases of inner ear, bilateral: Secondary | ICD-10-CM | POA: Diagnosis not present

## 2019-03-09 DIAGNOSIS — H903 Sensorineural hearing loss, bilateral: Secondary | ICD-10-CM | POA: Diagnosis not present

## 2019-03-11 DIAGNOSIS — Z23 Encounter for immunization: Secondary | ICD-10-CM | POA: Diagnosis not present

## 2019-03-27 ENCOUNTER — Encounter: Payer: Self-pay | Admitting: Internal Medicine

## 2019-03-27 ENCOUNTER — Other Ambulatory Visit: Payer: Self-pay | Admitting: Internal Medicine

## 2019-03-27 MED ORDER — COLESEVELAM HCL 625 MG PO TABS: ORAL_TABLET | ORAL | 3 refills | Status: AC

## 2019-04-03 NOTE — Telephone Encounter (Signed)
Key: [id number]

## 2019-04-07 DIAGNOSIS — N400 Enlarged prostate without lower urinary tract symptoms: Secondary | ICD-10-CM | POA: Diagnosis not present

## 2019-04-10 DIAGNOSIS — S83241A Other tear of medial meniscus, current injury, right knee, initial encounter: Secondary | ICD-10-CM | POA: Diagnosis not present

## 2019-04-10 DIAGNOSIS — S83231D Complex tear of medial meniscus, current injury, right knee, subsequent encounter: Secondary | ICD-10-CM | POA: Diagnosis not present

## 2019-04-17 DIAGNOSIS — M238X1 Other internal derangements of right knee: Secondary | ICD-10-CM | POA: Diagnosis not present

## 2019-05-27 DIAGNOSIS — S83231D Complex tear of medial meniscus, current injury, right knee, subsequent encounter: Secondary | ICD-10-CM | POA: Diagnosis not present

## 2019-05-27 DIAGNOSIS — Z20828 Contact with and (suspected) exposure to other viral communicable diseases: Secondary | ICD-10-CM | POA: Diagnosis not present

## 2019-05-27 DIAGNOSIS — Z01812 Encounter for preprocedural laboratory examination: Secondary | ICD-10-CM | POA: Diagnosis not present

## 2019-06-02 DIAGNOSIS — N400 Enlarged prostate without lower urinary tract symptoms: Secondary | ICD-10-CM | POA: Diagnosis not present

## 2019-06-02 DIAGNOSIS — M659 Synovitis and tenosynovitis, unspecified: Secondary | ICD-10-CM | POA: Diagnosis not present

## 2019-06-02 DIAGNOSIS — M65861 Other synovitis and tenosynovitis, right lower leg: Secondary | ICD-10-CM | POA: Diagnosis not present

## 2019-06-02 DIAGNOSIS — G4733 Obstructive sleep apnea (adult) (pediatric): Secondary | ICD-10-CM | POA: Diagnosis not present

## 2019-06-02 DIAGNOSIS — M94261 Chondromalacia, right knee: Secondary | ICD-10-CM | POA: Diagnosis not present

## 2019-06-02 DIAGNOSIS — S83241A Other tear of medial meniscus, current injury, right knee, initial encounter: Secondary | ICD-10-CM | POA: Diagnosis not present

## 2019-06-02 DIAGNOSIS — K2270 Barrett's esophagus without dysplasia: Secondary | ICD-10-CM | POA: Diagnosis not present

## 2019-06-02 DIAGNOSIS — S83231A Complex tear of medial meniscus, current injury, right knee, initial encounter: Secondary | ICD-10-CM | POA: Diagnosis not present

## 2019-06-02 DIAGNOSIS — I10 Essential (primary) hypertension: Secondary | ICD-10-CM | POA: Diagnosis not present

## 2019-06-02 DIAGNOSIS — M109 Gout, unspecified: Secondary | ICD-10-CM | POA: Diagnosis not present

## 2019-06-14 ENCOUNTER — Other Ambulatory Visit: Payer: Self-pay | Admitting: Internal Medicine

## 2019-07-19 IMAGING — DX DG CHEST 2V
2 series · 2 of 2 positions shown · non-contrast
Comparison: PA and lateral chest x-ray June 14, 2015

CLINICAL DATA: Two weeks of dyspnea on exertion with no other
complaints. History of hypertension, gastroesophageal reflux, never
smoked.

EXAM:
CHEST  2 VIEW

[chest pa]
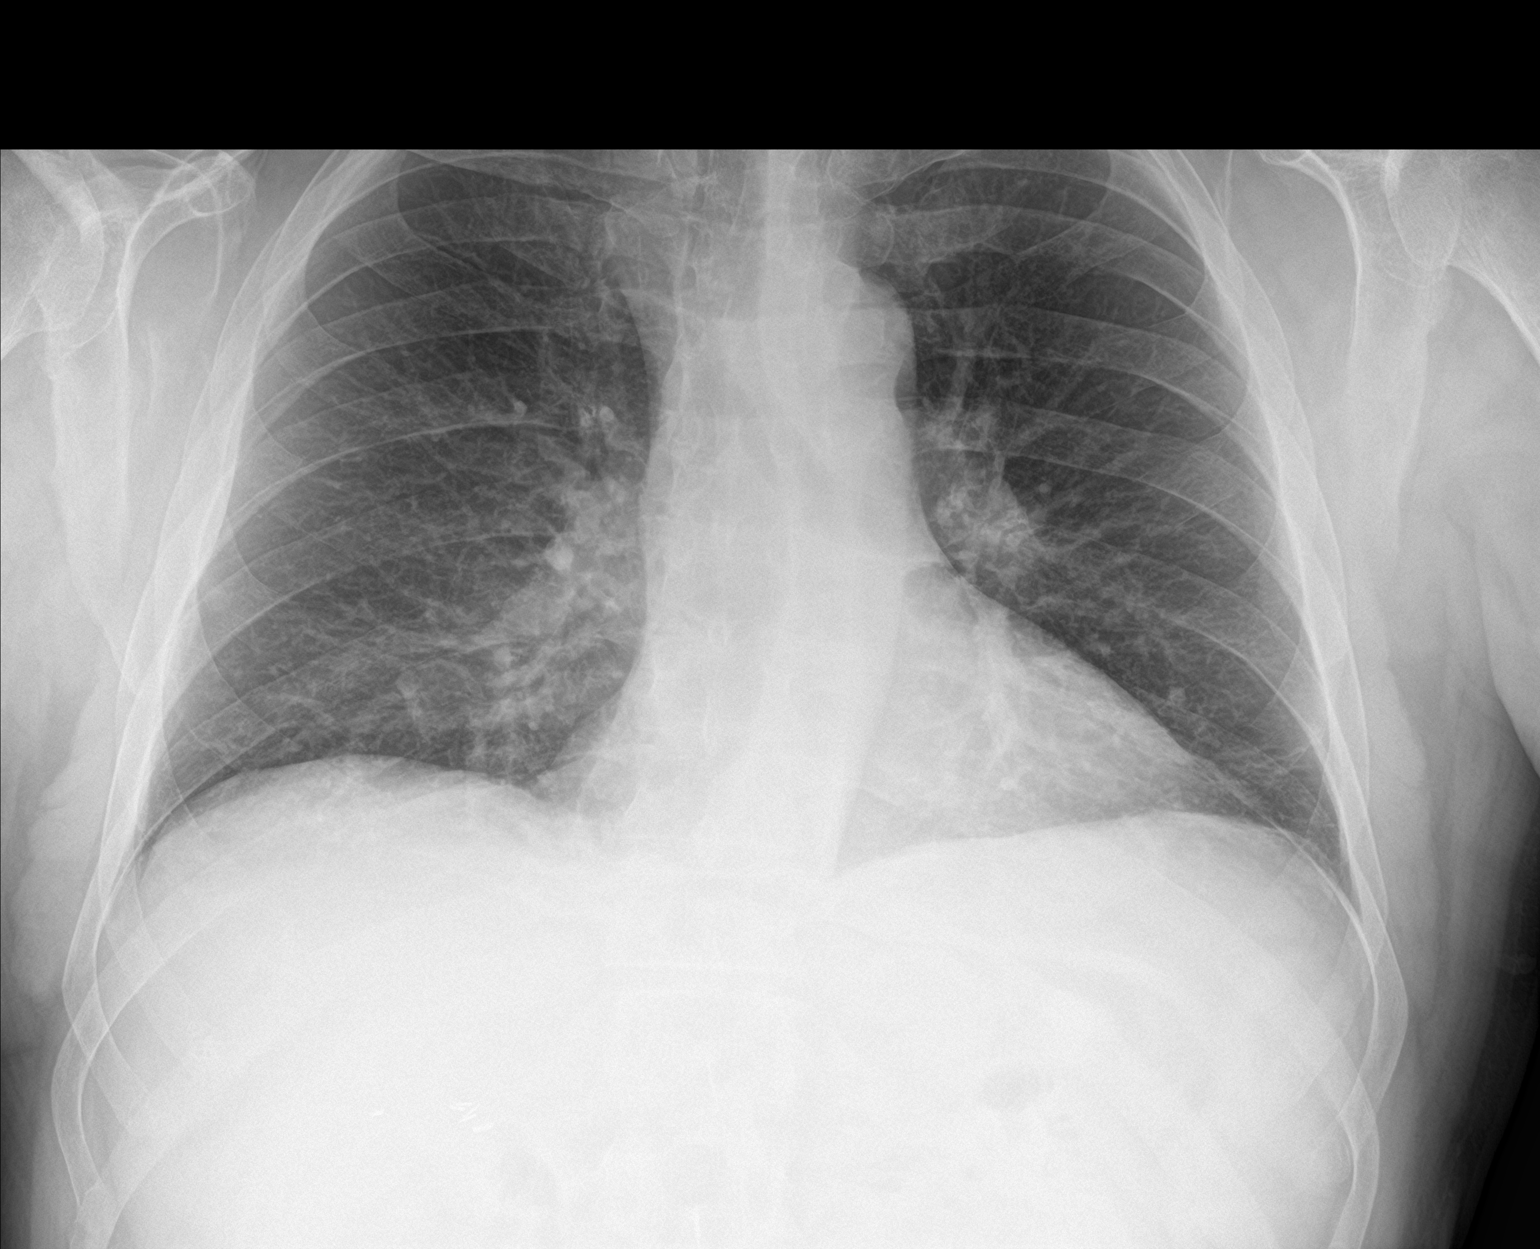

[chest lat]
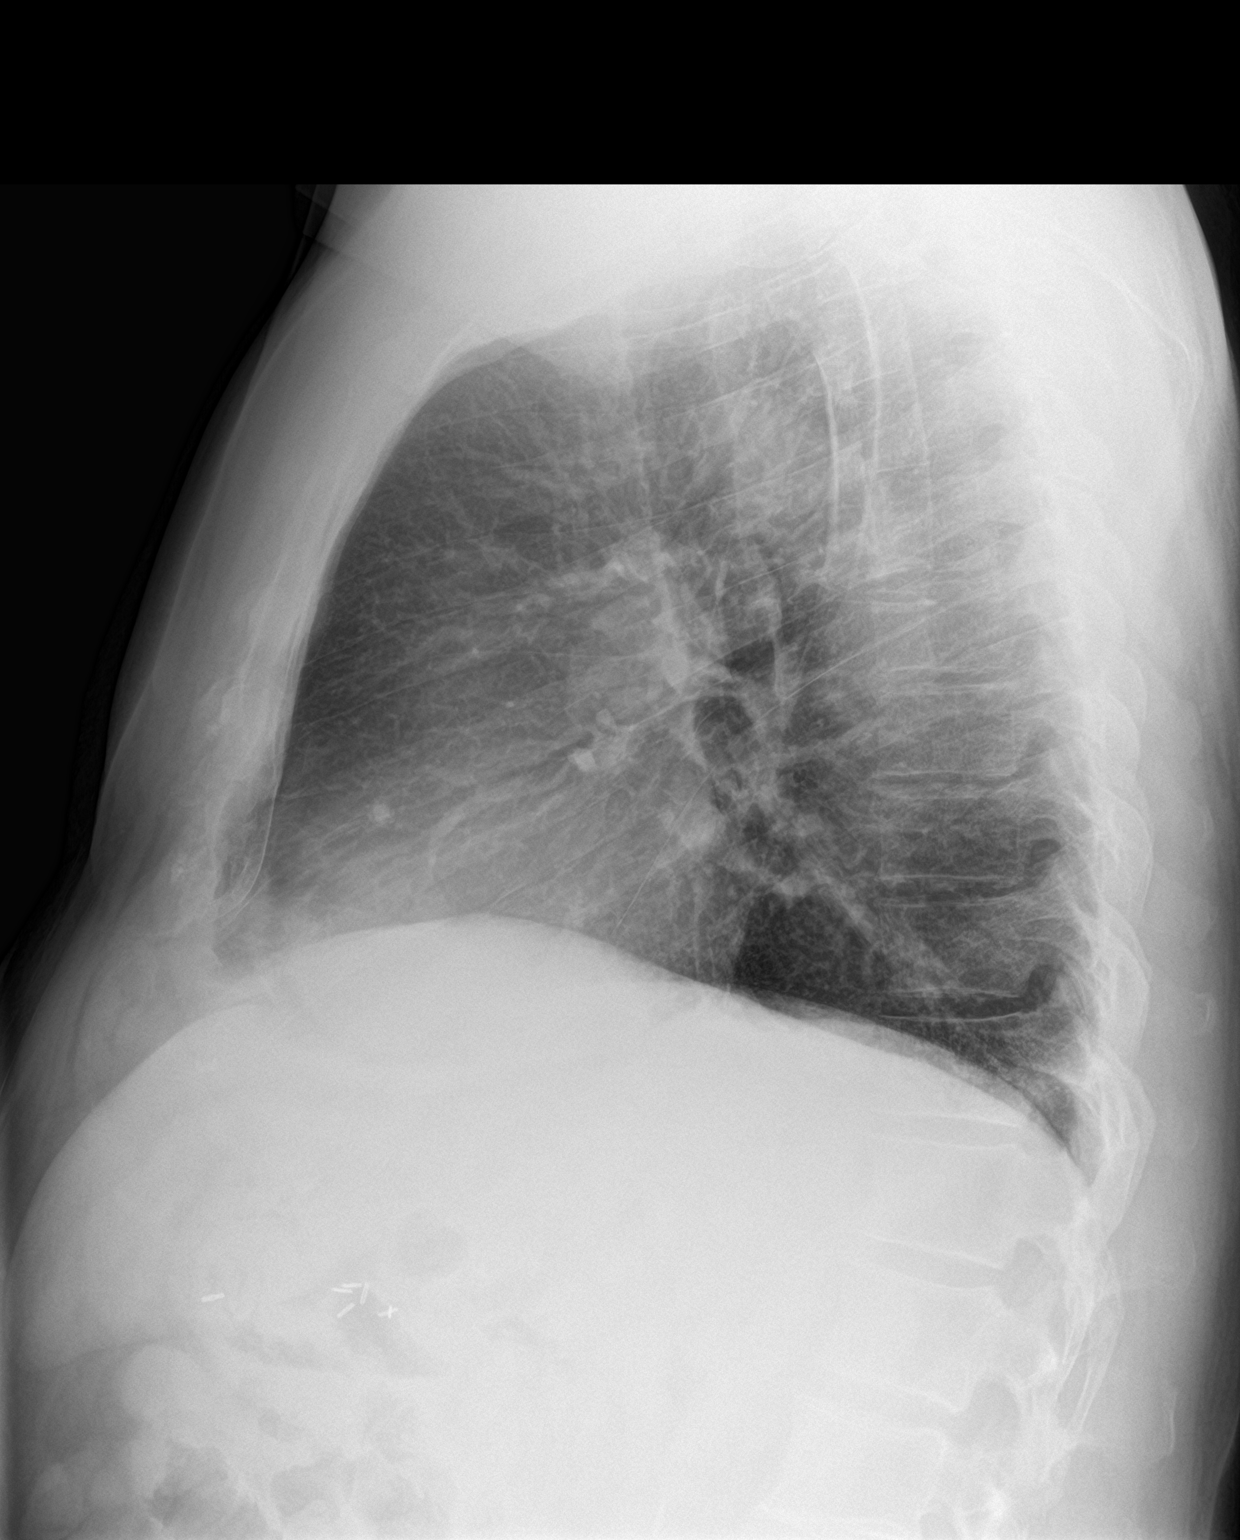

[2 of 2 positions shown; findings below may reference images not displayed]

FINDINGS: The lungs are adequately inflated. There is no focal infiltrate.
There is no pleural effusion. The heart and pulmonary vascularity
are normal. There is calcification in the wall of the aortic arch. A
stable calcified nodule is present in the lingula. The bony thorax
exhibits no acute abnormality.
IMPRESSION: There is no pneumonia, CHF, nor other acute cardiopulmonary
abnormality.

## 2019-09-12 ENCOUNTER — Other Ambulatory Visit: Payer: Self-pay | Admitting: Internal Medicine

## 2019-12-26 ENCOUNTER — Other Ambulatory Visit: Payer: Self-pay | Admitting: Internal Medicine

## 2020-06-03 ENCOUNTER — Other Ambulatory Visit: Payer: Self-pay | Admitting: Internal Medicine

## 2020-06-06 ENCOUNTER — Other Ambulatory Visit: Payer: Self-pay | Admitting: *Deleted

## 2020-07-03 ENCOUNTER — Other Ambulatory Visit: Payer: Self-pay | Admitting: Internal Medicine

## 2020-11-30 ENCOUNTER — Other Ambulatory Visit: Payer: Self-pay | Admitting: Internal Medicine
